# Patient Record
Sex: Female | Born: 1995 | Race: Black or African American | Hispanic: No | Marital: Single | Smoking: Current some day smoker
Health system: Southern US, Community
[De-identification: ages and names within clinical notes are randomized; demographics above are authoritative.]

## PROBLEM LIST (undated history)

## (undated) ENCOUNTER — Ambulatory Visit (HOSPITAL_COMMUNITY): Discharge status: Against Medical Advice | Payer: BLUE CROSS/BLUE SHIELD

## (undated) ENCOUNTER — Emergency Department (HOSPITAL_BASED_OUTPATIENT_CLINIC_OR_DEPARTMENT_OTHER): Admission: EM | Payer: No Typology Code available for payment source | Source: Home / Self Care

## (undated) DIAGNOSIS — G43709 Chronic migraine without aura, not intractable, without status migrainosus: Secondary | ICD-10-CM

## (undated) HISTORY — DX: Chronic migraine without aura, not intractable, without status migrainosus: G43.709

## (undated) HISTORY — PX: NO PAST SURGERIES: SHX2092

---
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
## 2015-03-05 ENCOUNTER — Encounter (HOSPITAL_BASED_OUTPATIENT_CLINIC_OR_DEPARTMENT_OTHER): Payer: Self-pay | Admitting: Emergency Medicine

## 2015-03-05 DIAGNOSIS — R51 Headache: Secondary | ICD-10-CM | POA: Diagnosis not present

## 2015-03-05 DIAGNOSIS — J45909 Unspecified asthma, uncomplicated: Secondary | ICD-10-CM | POA: Diagnosis not present

## 2015-03-05 NOTE — ED Notes (Signed)
Pt states she started having HA last Sunday went to school RN and they told her it ws allergies. Mom states she called crying because it wasn't getting any better.

## 2015-03-06 ENCOUNTER — Emergency Department (HOSPITAL_BASED_OUTPATIENT_CLINIC_OR_DEPARTMENT_OTHER)
Admission: EM | Admit: 2015-03-06 | Discharge: 2015-03-06 | Disposition: A | Discharge status: Home / Self Care | Payer: BLUE CROSS/BLUE SHIELD | Attending: Emergency Medicine | Admitting: Emergency Medicine

## 2015-03-06 ENCOUNTER — Emergency Department (HOSPITAL_BASED_OUTPATIENT_CLINIC_OR_DEPARTMENT_OTHER): Payer: BLUE CROSS/BLUE SHIELD

## 2015-03-06 DIAGNOSIS — R51 Headache: Secondary | ICD-10-CM

## 2015-03-06 DIAGNOSIS — R519 Headache, unspecified: Secondary | ICD-10-CM

## 2015-03-06 MED ORDER — KETOROLAC TROMETHAMINE 30 MG/ML IJ SOLN
30.0000 mg | Freq: Once | INTRAMUSCULAR | Status: AC
  Administered 2015-03-06: 30 mg via INTRAVENOUS
  Filled 2015-03-06: qty 1

## 2015-03-06 MED ORDER — DIPHENHYDRAMINE HCL 50 MG/ML IJ SOLN
25.0000 mg | Freq: Once | INTRAMUSCULAR | Status: AC
  Administered 2015-03-06: 25 mg via INTRAVENOUS
  Filled 2015-03-06: qty 1

## 2015-03-06 MED ORDER — METOCLOPRAMIDE HCL 10 MG PO TABS: 10.0000 mg | ORAL_TABLET | Freq: Four times a day (QID) | ORAL | Status: DC | PRN

## 2015-03-06 MED ORDER — SODIUM CHLORIDE 0.9 % IV BOLUS (SEPSIS)
1000.0000 mL | Freq: Once | INTRAVENOUS | Status: AC
  Administered 2015-03-06: 1000 mL via INTRAVENOUS

## 2015-03-06 MED ORDER — METOCLOPRAMIDE HCL 5 MG/ML IJ SOLN
10.0000 mg | Freq: Once | INTRAMUSCULAR | Status: AC
  Administered 2015-03-06: 10 mg via INTRAVENOUS
  Filled 2015-03-06: qty 2

## 2015-03-06 MED ORDER — HYDROCODONE-ACETAMINOPHEN 5-325 MG PO TABS
1.0000 | ORAL_TABLET | Freq: Four times a day (QID) | ORAL | Status: DC | PRN
Start: 2015-03-06 — End: 2017-09-01

## 2015-03-06 NOTE — ED Notes (Signed)
C/o ha x 10 days  Was given butalb-acetamine-caff at [facility]

## 2015-03-06 NOTE — ED Provider Notes (Signed)
CSN: [id number]     Arrival date & time 03/05/15  2216 History   First MD Initiated Contact with Patient 03/06/15 0150     Chief Complaint  Patient presents with  . Headache     (Consider location/radiation/quality/duration/timing/severity/associated sxs/prior Treatment) HPI  This is a 19 year old female complains of a headache the past 2 weeks. She states it is sometimes frontal and sometimes in the bitemporal regions. She rates it a 7 out of 10 at the present time. It does wax and wane. There is no associated blurred vision, photophobia, nausea or vomiting. There is no associated numbness or weakness. She has taken Tylenol without relief. She was given a Fioricet at school yesterday without relief. She does not have a history of chronic headaches nor does she have a family history of migraines.  Past Medical History  Diagnosis Date  . Asthma    History reviewed. No pertinent past surgical history. History reviewed. No pertinent family history. History  Substance Use Topics  . Smoking status: Never Smoker   . Smokeless tobacco: Not on file  . Alcohol Use: Yes   OB History    No data available     Review of Systems  All other systems reviewed and are negative.   Allergies  Review of patient's allergies indicates no known allergies.  Home Medications   Prior to Admission medications   Not on File   BP 118/68 mmHg  Pulse 56  Temp(Src) 98.3 F (36.8 C) (Oral)  Resp 18  Ht 5\' 2"  (1.575 m)  Wt 164 lb (74.39 kg)  BMI 29.99 kg/m2  SpO2 100%  LMP 03/05/2015   Physical Exam  General: Well-developed, well-nourished female in no acute distress; appearance consistent with age of record HENT: normocephalic; atraumatic Eyes: pupils equal, round and reactive to light; extraocular muscles intact Neck: supple Heart: regular rate and rhythm Lungs: clear to auscultation bilaterally Abdomen: soft; nondistended; nontender; bowel sounds present Extremities: No deformity; full  range of motion; pulses normal Neurologic: Awake, alert and oriented; motor function intact in all extremities and symmetric; no facial droop; normal coordination and speech; negative Romberg; normal finger to nose Skin: Warm and dry Psychiatric: Normal mood and affect    ED Course  Procedures (including critical care time)   MDM  Nursing notes and vitals signs, including pulse oximetry, reviewed.  Summary of this visit's results, reviewed by myself:  Imaging Studies: Ct Head Wo Contrast  03/06/2015   CLINICAL DATA:  Severe headaches for 2 weeks  EXAM: CT HEAD WITHOUT CONTRAST  TECHNIQUE: Contiguous axial images were obtained from the base of the skull through the vertex without intravenous contrast.  COMPARISON:  None.  FINDINGS: Ventricles and sulci appear symmetrical. No mass effect or midline shift. No abnormal extra-axial fluid collections. Gray-white matter junctions are distinct. Basal cisterns are not effaced. No evidence of acute intracranial hemorrhage. No depressed skull fractures. Visualized paranasal sinuses and mastoid air cells are not opacified.  IMPRESSION: No acute intracranial abnormalities.   Electronically Signed   By: Burman NievesWilliam  Stevens M.D.   On: 03/06/2015 02:38    4:35 AM Headache significantly improved after IV fluids and medications.    Paula LibraJohn Mingo Siegert, MD 03/06/15 [phone]

## 2015-10-06 ENCOUNTER — Emergency Department (HOSPITAL_COMMUNITY)
Admission: EM | Admit: 2015-10-06 | Discharge: 2015-10-07 | Disposition: A | Discharge status: Home / Self Care | Payer: BLUE CROSS/BLUE SHIELD | Attending: Emergency Medicine | Admitting: Emergency Medicine

## 2015-10-06 ENCOUNTER — Emergency Department (HOSPITAL_COMMUNITY): Payer: BLUE CROSS/BLUE SHIELD

## 2015-10-06 ENCOUNTER — Encounter (HOSPITAL_COMMUNITY): Payer: Self-pay | Admitting: *Deleted

## 2015-10-06 DIAGNOSIS — N739 Female pelvic inflammatory disease, unspecified: Secondary | ICD-10-CM | POA: Diagnosis not present

## 2015-10-06 DIAGNOSIS — J45909 Unspecified asthma, uncomplicated: Secondary | ICD-10-CM | POA: Diagnosis not present

## 2015-10-06 DIAGNOSIS — N730 Acute parametritis and pelvic cellulitis: Secondary | ICD-10-CM

## 2015-10-06 DIAGNOSIS — R109 Unspecified abdominal pain: Secondary | ICD-10-CM | POA: Diagnosis present

## 2015-10-06 DIAGNOSIS — Z3202 Encounter for pregnancy test, result negative: Secondary | ICD-10-CM | POA: Diagnosis not present

## 2015-10-06 LAB — URINE MICROSCOPIC-ADD ON

## 2015-10-06 LAB — CBC
HCT: 38.8 % (ref 36.0–46.0)
HEMOGLOBIN: 12.5 g/dL (ref 12.0–15.0)
MCH: 27.1 pg (ref 26.0–34.0)
MCHC: 32.2 g/dL (ref 30.0–36.0)
MCV: 84.2 fL (ref 78.0–100.0)
Platelets: 246 10*3/uL (ref 150–400)
RBC: 4.61 MIL/uL (ref 3.87–5.11)
RDW: 13.7 % (ref 11.5–15.5)
WBC: 6.9 10*3/uL (ref 4.0–10.5)

## 2015-10-06 LAB — COMPREHENSIVE METABOLIC PANEL
ALBUMIN: 3.7 g/dL (ref 3.5–5.0)
ALK PHOS: 47 U/L (ref 38–126)
ALT: 10 U/L — ABNORMAL LOW (ref 14–54)
AST: 18 U/L (ref 15–41)
Anion gap: 6 (ref 5–15)
BILIRUBIN TOTAL: 0.6 mg/dL (ref 0.3–1.2)
BUN: 6 mg/dL (ref 6–20)
CO2: 29 mmol/L (ref 22–32)
Calcium: 9 mg/dL (ref 8.9–10.3)
Chloride: 99 mmol/L — ABNORMAL LOW (ref 101–111)
Creatinine, Ser: 1.04 mg/dL — ABNORMAL HIGH (ref 0.44–1.00)
GFR calc Af Amer: >60 mL/min (ref >60)
GFR calc non Af Amer: >60 mL/min (ref >60)
GLUCOSE: 112 mg/dL — AB (ref 65–99)
Potassium: 3.3 mmol/L — ABNORMAL LOW (ref 3.5–5.1)
Sodium: 134 mmol/L — ABNORMAL LOW (ref 135–145)
TOTAL PROTEIN: 6.7 g/dL (ref 6.5–8.1)

## 2015-10-06 LAB — URINALYSIS, ROUTINE W REFLEX MICROSCOPIC
BILIRUBIN URINE: NEGATIVE
Glucose, UA: NEGATIVE mg/dL
HGB URINE DIPSTICK: NEGATIVE
Ketones, ur: NEGATIVE mg/dL
Nitrite: NEGATIVE
PROTEIN: NEGATIVE mg/dL
Specific Gravity, Urine: 1.01 (ref 1.005–1.030)
UROBILINOGEN UA: 0.2 mg/dL (ref 0.0–1.0)
pH: 7.5 (ref 5.0–8.0)

## 2015-10-06 LAB — WET PREP, GENITAL
Trich, Wet Prep: NONE SEEN
Yeast Wet Prep HPF POC: NONE SEEN

## 2015-10-06 LAB — POC URINE PREG, ED: PREG TEST UR: NEGATIVE

## 2015-10-06 LAB — LIPASE, BLOOD: Lipase: 26 U/L (ref 22–51)

## 2015-10-06 MED ORDER — SODIUM CHLORIDE 0.9 % IV BOLUS (SEPSIS)
1000.0000 mL | Freq: Once | INTRAVENOUS | Status: AC
  Administered 2015-10-06: 1000 mL via INTRAVENOUS

## 2015-10-06 MED ORDER — CEFTRIAXONE SODIUM 250 MG IJ SOLR
250.0000 mg | Freq: Once | INTRAMUSCULAR | Status: AC
  Administered 2015-10-06: 250 mg via INTRAMUSCULAR
  Filled 2015-10-06: qty 250

## 2015-10-06 MED ORDER — TRAMADOL HCL 50 MG PO TABS: 50.0000 mg | ORAL_TABLET | Freq: Four times a day (QID) | ORAL | Status: DC | PRN

## 2015-10-06 MED ORDER — IOHEXOL 300 MG/ML  SOLN
25.0000 mL | Freq: Once | INTRAMUSCULAR | Status: DC | PRN
  Administered 2015-10-06: 25 mL via ORAL
  Filled 2015-10-06: qty 30

## 2015-10-06 MED ORDER — IOHEXOL 300 MG/ML  SOLN
100.0000 mL | Freq: Once | INTRAMUSCULAR | Status: AC | PRN
  Administered 2015-10-06: 100 mL via INTRAVENOUS

## 2015-10-06 MED ORDER — DOXYCYCLINE HYCLATE 100 MG PO CAPS: 100.0000 mg | ORAL_CAPSULE | Freq: Two times a day (BID) | ORAL | Status: DC

## 2015-10-06 MED ORDER — KETOROLAC TROMETHAMINE 30 MG/ML IJ SOLN
30.0000 mg | Freq: Once | INTRAMUSCULAR | Status: AC
  Administered 2015-10-06: 30 mg via INTRAVENOUS
  Filled 2015-10-06: qty 1

## 2015-10-06 NOTE — ED Notes (Signed)
Pt returned from CT °

## 2015-10-06 NOTE — ED Notes (Signed)
The pt is c/o abd pain since yesterday no nv and d.  lmp last month

## 2015-10-06 NOTE — ED Notes (Signed)
Pt hooked up to the monitor with the BP cuff and pulse ox 

## 2015-10-06 NOTE — Discharge Instructions (Signed)

## 2015-10-06 NOTE — ED Provider Notes (Signed)
CSN: [id number]     Arrival date & time 10/06/15  2037 History   First MD Initiated Contact with Patient 10/06/15 2120     Chief Complaint  Patient presents with  . Abdominal Pain     (Consider location/radiation/quality/duration/timing/severity/associated sxs/prior Treatment) HPI Comments: 19 year old female withpast medical history presents to the emergency department for further evaluation of abdominal pain. Patient states that she has had abdominal pain intermittently in her right lower quadrant over the past month, but pain became fairly constant yesterday. Patient states the pain has been worsening. She describes the pain as sharp in nature when present. She also states that she has had increased pain with coughing or movement. She has taken ibuprofen for symptoms without relief. She reports some mild nausea yesterday without vomiting. No fever, vaginal discharge or bleeding, diarrhea, melena, or hematochezia. Last bowel movement was in the last 24 hours in normal, per patient. Patient's last menstrual period was one month ago. She has a history of irregular menses. She denies any history of prior pregnancies. She is sexually active with 1 partner and denies the use of condoms. Patient denies history of abdominal surgeries.  Patient is a 19 y.o. female presenting with abdominal pain. The history is provided by the patient. No language interpreter was used.  Abdominal Pain Associated symptoms: nausea     Past Medical History  Diagnosis Date  . Asthma    History reviewed. No pertinent past surgical history. No family history on file. Social History  Substance Use Topics  . Smoking status: Never Smoker   . Smokeless tobacco: None  . Alcohol Use: Yes   OB History    No data available      Review of Systems  Gastrointestinal: Positive for nausea and abdominal pain.  All other systems reviewed and are negative.   Allergies  Review of patient's allergies indicates no known  allergies.  Home Medications   Prior to Admission medications   Medication Sig Start Date End Date Taking? Authorizing Provider  ibuprofen (ADVIL,MOTRIN) 200 MG tablet Take 200 mg by mouth every 6 (six) hours as needed for moderate pain.   Yes Historical Provider, MD  doxycycline (VIBRAMYCIN) 100 MG capsule Take 1 capsule (100 mg total) by mouth 2 (two) times daily. Take for 14 days 10/06/15   Antony Madura, PA-C  HYDROcodone-acetaminophen (NORCO) 5-325 MG per tablet Take 1-2 tablets by mouth every 6 (six) hours as needed (headache). Patient not taking: Reported on 10/06/2015 03/06/15   Paula Libra, MD  metoCLOPramide (REGLAN) 10 MG tablet Take 1 tablet (10 mg total) by mouth every 6 (six) hours as needed (nausea or headache). Patient not taking: Reported on 10/06/2015 03/06/15   Paula Libra, MD  traMADol (ULTRAM) 50 MG tablet Take 1 tablet (50 mg total) by mouth every 6 (six) hours as needed. 10/06/15   Antony Madura, PA-C   BP 112/68 mmHg  Pulse 62  Temp(Src) 98.8 F (37.1 C)  Resp 16  Ht 5\' 2"  (1.575 m)  Wt 162 lb 4 oz (73.596 kg)  BMI 29.67 kg/m2  SpO2 100%  LMP 09/06/2015   Physical Exam  Constitutional: She is oriented to person, place, and time. She appears well-developed and well-nourished. No distress.  Nontoxic/nonseptic appearing.  HENT:  Head: Normocephalic and atraumatic.  Eyes: Conjunctivae and EOM are normal. No scleral icterus.  Neck: Normal range of motion.  Cardiovascular: Normal rate, regular rhythm and intact distal pulses.   Pulmonary/Chest: Effort normal. No respiratory distress. She has no  wheezes. She has no rales.  Respirations even and unlabored.  Abdominal: Soft. She exhibits no distension. There is tenderness. There is no rebound.  TTP is fairly diffuse, but patient reports worse pain with palpation in the RLQ. There is mild voluntary guarding to palpation in the RLQ. No masses or peritoneal signs. Abdomen soft.  Genitourinary: There is no rash, tenderness, lesion  or injury on the right labia. There is no rash, tenderness, lesion or injury on the left labia. Uterus is tender (mild/moderate). Cervix exhibits no motion tenderness. Right adnexum displays no mass, no tenderness and no fullness. Left adnexum displays no mass, no tenderness and no fullness. No bleeding in the vagina. Vaginal discharge (copious, white, thick) found.  Musculoskeletal: Normal range of motion.  Neurological: She is alert and oriented to person, place, and time. She exhibits normal muscle tone. Coordination normal.  Skin: Skin is warm and dry. No rash noted. She is not diaphoretic. No erythema. No pallor.  Psychiatric: She has a normal mood and affect. Her behavior is normal.  Nursing note and vitals reviewed.   ED Course  Procedures (including critical care time) Labs Review Labs Reviewed  WET PREP, GENITAL - Abnormal; Notable for the following:    Clue Cells Wet Prep HPF POC MANY (*)    WBC, Wet Prep HPF POC MANY (*)    All other components within normal limits  COMPREHENSIVE METABOLIC PANEL - Abnormal; Notable for the following:    Sodium 134 (*)    Potassium 3.3 (*)    Chloride 99 (*)    Glucose, Bld 112 (*)    Creatinine, Ser 1.04 (*)    ALT 10 (*)    All other components within normal limits  URINALYSIS, ROUTINE W REFLEX MICROSCOPIC (NOT AT [facility]) - Abnormal; Notable for the following:    APPearance CLOUDY (*)    Leukocytes, UA TRACE (*)    All other components within normal limits  URINE MICROSCOPIC-ADD ON - Abnormal; Notable for the following:    Squamous Epithelial / LPF FEW (*)    Bacteria, UA FEW (*)    All other components within normal limits  LIPASE, BLOOD  CBC  POC URINE PREG, ED  GC/CHLAMYDIA PROBE AMP ([place]) NOT AT [facility]    Imaging Review Ct Abdomen Pelvis W Contrast  10/06/2015   CLINICAL DATA:  19 year old female with lower abdomen and periumbilical pain since yesterday. Initial encounter.  EXAM: CT ABDOMEN AND PELVIS WITH CONTRAST   TECHNIQUE: Multidetector CT imaging of the abdomen and pelvis was performed using the standard protocol following bolus administration of intravenous contrast.  CONTRAST:  25mL OMNIPAQUE IOHEXOL 300 MG/ML SOLN, OMNIPAQUE IOHEXOL 300 MG/ML SOLN  COMPARISON:  None.  FINDINGS: Negative lung bases.  No pericardial or pleural effusion.  Negative visualized osseous structures. Probable benign bone island in the mid lumbar vertebral body.  Small volume of pelvic free fluid. Parametrial soft tissue stranding suggested along the lower uterine segment. There is gas in the vagina and mucosal hyper enhancement at the vaginal vault. Toe discrete adnexal abnormality. Uterine fundal enhancement might be physiologic.  Decompressed distal colon. Diminutive urinary bladder. Mildly redundant partially decompressed sigmoid.  Left colon, transverse colon, right colon, and appendix (series 2, image 58) are within normal limits. Negative terminal ileum. Oral contrast has not yet reached the mid small bowel. No dilated or abnormal small bowel loops identified. The stomach is distended with food and contrast. Negative duodenum.  No pneumoperitoneum or abdominal free fluid.  Liver, spleen, pancreas and adrenal glands are within normal limits. Portal venous system and major arterial structures are within normal limits. No lymphadenopathy identified. Both kidneys appear normal.  IMPRESSION: 1. Normal appendix. 2. Suggestion of inflammation about the lower uterine segment and vagina with trace pelvic free fluid. Consider pelvic inflammatory disease.   Electronically Signed   By: Odessa Fleming M.D.   On: 10/06/2015 23:13     I have personally reviewed and evaluated these images and lab results as part of my medical decision-making.   EKG Interpretation None      MDM   Final diagnoses:  PID (acute pelvic inflammatory disease)    Patient presenting to the emergency department for evaluation of abdominal pain. Abdominal pain is  present mostly in the right lower quadrant and has been worsening over the past 2 days. Patient has no leukocytosis or fever. Abdominal exam is nonsurgical; no peritoneal signs. Tenderness was noted mostly in the right lower quadrant and suprapubic abdomen, though tenderness was also fairly diffuse in nature. GU exam with mild uterine tenderness. No CMT or adnexal TTP.   CT obtained for further evaluation of symptoms. Appendix is normal in appearance. There is evidence of inflammation to the lower uterine segment suggestive of pelvic inflammatory disease. Many white blood cells on wet prep is consistent with this diagnosis. Will treat for PID today with 250 g IM Rocephin and oral doxycycline twice a day 14 days. Patient advised follow-up with an OB/GYN for recheck in one week. Return precautions discussed and provided. Patient agreeable to plan with no unaddressed concerns. Patient discharged in good condition.   Filed Vitals:   10/06/15 2200 10/06/15 2215 10/06/15 2306 10/06/15 2315  BP: 112/54 117/66 112/78 112/68  Pulse: 69 73  62  Temp:      Resp:   16   Height:      Weight:      SpO2: 100% 100%  100%     Antony Madura, PA-C 10/06/15 1308  Eber Hong, MD 10/07/15 [phone]

## 2015-10-07 LAB — CERVICOVAGINAL ANCILLARY ONLY
CHLAMYDIA, DNA PROBE: NEGATIVE
NEISSERIA GONORRHEA: NEGATIVE

## 2016-06-18 DIAGNOSIS — Z0000 Encounter for general adult medical examination without abnormal findings: Secondary | ICD-10-CM | POA: Diagnosis not present

## 2016-06-18 DIAGNOSIS — F329 Major depressive disorder, single episode, unspecified: Secondary | ICD-10-CM | POA: Diagnosis not present

## 2016-07-15 DIAGNOSIS — Z7189 Other specified counseling: Secondary | ICD-10-CM | POA: Diagnosis not present

## 2016-07-15 DIAGNOSIS — Z3009 Encounter for other general counseling and advice on contraception: Secondary | ICD-10-CM | POA: Diagnosis not present

## 2016-07-15 DIAGNOSIS — Z23 Encounter for immunization: Secondary | ICD-10-CM | POA: Diagnosis not present

## 2016-08-16 DIAGNOSIS — A09 Infectious gastroenteritis and colitis, unspecified: Secondary | ICD-10-CM | POA: Diagnosis not present

## 2016-11-10 ENCOUNTER — Encounter (HOSPITAL_COMMUNITY): Payer: Self-pay

## 2016-11-10 DIAGNOSIS — R109 Unspecified abdominal pain: Secondary | ICD-10-CM | POA: Diagnosis present

## 2016-11-10 DIAGNOSIS — R05 Cough: Secondary | ICD-10-CM | POA: Diagnosis not present

## 2016-11-10 DIAGNOSIS — Z79899 Other long term (current) drug therapy: Secondary | ICD-10-CM | POA: Insufficient documentation

## 2016-11-10 DIAGNOSIS — J45909 Unspecified asthma, uncomplicated: Secondary | ICD-10-CM | POA: Insufficient documentation

## 2016-11-10 DIAGNOSIS — R1033 Periumbilical pain: Secondary | ICD-10-CM | POA: Insufficient documentation

## 2016-11-10 LAB — URINALYSIS, ROUTINE W REFLEX MICROSCOPIC
BILIRUBIN URINE: NEGATIVE
GLUCOSE, UA: NEGATIVE mg/dL
HGB URINE DIPSTICK: NEGATIVE
Ketones, ur: NEGATIVE mg/dL
Leukocytes, UA: NEGATIVE
Nitrite: NEGATIVE
PROTEIN: NEGATIVE mg/dL
Specific Gravity, Urine: 1.004 — ABNORMAL LOW (ref 1.005–1.030)
pH: 7.5 (ref 5.0–8.0)

## 2016-11-10 LAB — CBC
HEMATOCRIT: 40.2 % (ref 36.0–46.0)
Hemoglobin: 13.3 g/dL (ref 12.0–15.0)
MCH: 27.7 pg (ref 26.0–34.0)
MCHC: 33.1 g/dL (ref 30.0–36.0)
MCV: 83.8 fL (ref 78.0–100.0)
Platelets: 185 10*3/uL (ref 150–400)
RBC: 4.8 MIL/uL (ref 3.87–5.11)
RDW: 14.1 % (ref 11.5–15.5)
WBC: 5.5 10*3/uL (ref 4.0–10.5)

## 2016-11-10 LAB — COMPREHENSIVE METABOLIC PANEL
ALT: 12 U/L — ABNORMAL LOW (ref 14–54)
AST: 22 U/L (ref 15–41)
Albumin: 4.1 g/dL (ref 3.5–5.0)
Alkaline Phosphatase: 45 U/L (ref 38–126)
Anion gap: 7 (ref 5–15)
BUN: 5 mg/dL — AB (ref 6–20)
CHLORIDE: 105 mmol/L (ref 101–111)
CO2: 26 mmol/L (ref 22–32)
Calcium: 9.3 mg/dL (ref 8.9–10.3)
Creatinine, Ser: 0.89 mg/dL (ref 0.44–1.00)
GFR calc Af Amer: >60 mL/min (ref >60)
GFR calc non Af Amer: >60 mL/min (ref >60)
Glucose, Bld: 93 mg/dL (ref 65–99)
Potassium: 3.2 mmol/L — ABNORMAL LOW (ref 3.5–5.1)
SODIUM: 138 mmol/L (ref 135–145)
Total Bilirubin: 0.4 mg/dL (ref 0.3–1.2)
Total Protein: 6.7 g/dL (ref 6.5–8.1)

## 2016-11-10 LAB — LIPASE, BLOOD: LIPASE: 29 U/L (ref 11–51)

## 2016-11-10 LAB — I-STAT BETA HCG BLOOD, ED (MC, WL, AP ONLY)

## 2016-11-10 NOTE — ED Triage Notes (Signed)
Onset 2 hours PTA abd pain around umbilicus, intermittant.  Onset 2 days non productive cough, and today cough caused vomiting.  No fever, diarrhea.

## 2016-11-11 ENCOUNTER — Emergency Department (HOSPITAL_COMMUNITY)
Admission: EM | Admit: 2016-11-11 | Discharge: 2016-11-11 | Disposition: A | Discharge status: Home / Self Care | Payer: BLUE CROSS/BLUE SHIELD | Attending: Emergency Medicine | Admitting: Emergency Medicine

## 2016-11-11 DIAGNOSIS — N760 Acute vaginitis: Secondary | ICD-10-CM | POA: Diagnosis not present

## 2016-11-11 DIAGNOSIS — R05 Cough: Secondary | ICD-10-CM | POA: Diagnosis not present

## 2016-11-11 DIAGNOSIS — B9689 Other specified bacterial agents as the cause of diseases classified elsewhere: Secondary | ICD-10-CM | POA: Diagnosis not present

## 2016-11-11 DIAGNOSIS — J209 Acute bronchitis, unspecified: Secondary | ICD-10-CM | POA: Diagnosis not present

## 2016-11-11 DIAGNOSIS — R1033 Periumbilical pain: Secondary | ICD-10-CM

## 2016-11-11 LAB — WET PREP, GENITAL
Sperm: NONE SEEN
Trich, Wet Prep: NONE SEEN
YEAST WET PREP: NONE SEEN

## 2016-11-11 MED ORDER — DICYCLOMINE HCL 10 MG PO CAPS
10.0000 mg | ORAL_CAPSULE | Freq: Once | ORAL | Status: AC
  Administered 2016-11-11: 10 mg via ORAL
  Filled 2016-11-11: qty 1

## 2016-11-11 MED ORDER — DICYCLOMINE HCL 20 MG PO TABS: 20.0000 mg | ORAL_TABLET | Freq: Two times a day (BID) | ORAL | 0 refills | Status: DC

## 2016-11-11 MED ORDER — KETOROLAC TROMETHAMINE 15 MG/ML IJ SOLN
15.0000 mg | Freq: Once | INTRAMUSCULAR | Status: AC
  Administered 2016-11-11: 15 mg via INTRAMUSCULAR
  Filled 2016-11-11: qty 1

## 2016-11-11 NOTE — ED Provider Notes (Signed)
[facility]-EMERGENCY DEPT Provider Note   CSN: [id number] Arrival date & time: 11/10/16  1946   By signing my name below, I, [name], attest that this documentation has been prepared under the direction and in the presence of Shon Baton, MD  Electronically Signed: [name], ED Scribe. 11/11/16. 1:04 AM.   History   Chief Complaint Chief Complaint  Patient presents with  . Abdominal Pain  . Cough   The history is provided by the patient. No language interpreter was used.   HPI Comments:  Wanda Guzman is a 20 y.o. female who presents to the Emergency Department complaining of sudden onset, intermittent episodes of moderate abdominal pain x several months. Pt states her pain worsened today but her current pain severity is a "3/10". She also notes a dry cough. No exacerbating and alleviating factors noted. Pt denies vomiting, diarrhea, hematuria, dysuria, vaginal discharge, abnormal bowel movments, concern for STDs and any other complaints at this time. Pt also denies any major medical problems and any drug allergies.   Past Medical History:  Diagnosis Date  . Asthma     There are no active problems to display for this patient.   History reviewed. No pertinent surgical history.  OB History    No data available       Home Medications    Prior to Admission medications   Medication Sig Start Date End Date Taking? Authorizing Provider  ibuprofen (ADVIL,MOTRIN) 200 MG tablet Take 200 mg by mouth every 6 (six) hours as needed for moderate pain.   Yes Historical Provider, MD  Phenylephrine-Pheniramine-DM [facility] COLD & COUGH PO) Take 30 mLs by mouth daily as needed. For flu and cold symptoms   Yes Historical Provider, MD  dicyclomine (BENTYL) 20 MG tablet Take 1 tablet (20 mg total) by mouth 2 (two) times daily. 11/11/16   Shon Baton, MD  doxycycline (VIBRAMYCIN) 100 MG capsule Take 1 capsule (100 mg total) by mouth 2 (two) times daily. Take for 14  days Patient not taking: Reported on 11/10/2016 10/06/15   Antony Madura, PA-C  HYDROcodone-acetaminophen (NORCO) 5-325 MG per tablet Take 1-2 tablets by mouth every 6 (six) hours as needed (headache). Patient not taking: Reported on 11/10/2016 03/06/15   Paula Libra, MD  metoCLOPramide (REGLAN) 10 MG tablet Take 1 tablet (10 mg total) by mouth every 6 (six) hours as needed (nausea or headache). Patient not taking: Reported on 11/10/2016 03/06/15   Paula Libra, MD  traMADol (ULTRAM) 50 MG tablet Take 1 tablet (50 mg total) by mouth every 6 (six) hours as needed. Patient not taking: Reported on 11/10/2016 10/06/15   Antony Madura, PA-C    Family History History reviewed. No pertinent family history.  Social History Social History  Substance Use Topics  . Smoking status: Never Smoker  . Smokeless tobacco: Never Used  . Alcohol use Yes     Allergies   Patient has no known allergies.   Review of Systems Review of Systems  Constitutional: Negative for fever.  Respiratory: Positive for cough.   Gastrointestinal: Positive for abdominal pain. Negative for diarrhea and vomiting.  Genitourinary: Negative for dysuria, hematuria and vaginal discharge.  All other systems reviewed and are negative.    Physical Exam Updated Vital Signs BP 122/63 (BP Location: Left Arm)   Pulse 86   Temp 98.8 F (37.1 C) (Oral)   Resp 20   Ht 5\' 2"  (1.575 m)   Wt 145 lb 6.4 oz (66 kg)   LMP  10/20/2016   SpO2 100%   BMI 26.59 kg/m   Physical Exam  Constitutional: She is oriented to person, place, and time. She appears well-developed and well-nourished. No distress.  HENT:  Head: Normocephalic and atraumatic.  Cardiovascular: Normal rate, regular rhythm and normal heart sounds.   Pulmonary/Chest: Effort normal and breath sounds normal. No respiratory distress. She has no wheezes.  Abdominal: Soft. Bowel sounds are normal. She exhibits no mass. There is no tenderness. There is no guarding.  Genitourinary:  Vagina normal.  Genitourinary Comments: Normal external vaginal exam, moderate vaginal discharge, his cervical motion tenderness, no adnexal tenderness or mass  Neurological: She is alert and oriented to person, place, and time.  Skin: Skin is warm and dry.  Psychiatric: She has a normal mood and affect.  Nursing note and vitals reviewed.    ED Treatments / Results  DIAGNOSTIC STUDIES:  Oxygen Saturation is 100% on RA, normal by my interpretation.    COORDINATION OF CARE:  1:00 AM Discussed treatment plan with pt at bedside and pt agreed to plan.  Labs (all labs ordered are listed, but only abnormal results are displayed) Labs Reviewed  WET PREP, GENITAL - Abnormal; Notable for the following:       Result Value   Clue Cells Wet Prep HPF POC PRESENT (*)    WBC, Wet Prep HPF POC MANY (*)    All other components within normal limits  COMPREHENSIVE METABOLIC PANEL - Abnormal; Notable for the following:    Potassium 3.2 (*)    BUN 5 (*)    ALT 12 (*)    All other components within normal limits  URINALYSIS, ROUTINE W REFLEX MICROSCOPIC (NOT AT [facility]) - Abnormal; Notable for the following:    Color, Urine STRAW (*)    Specific Gravity, Urine 1.004 (*)    All other components within normal limits  LIPASE, BLOOD  CBC  I-STAT BETA HCG BLOOD, ED ([facility], [facility], [facility] ONLY)  GC/CHLAMYDIA PROBE AMP ([place]) NOT AT [facility]    EKG  EKG Interpretation None       Radiology No results found.  Procedures Procedures (including critical care time)  Medications Ordered in ED Medications  dicyclomine (BENTYL) capsule 10 mg (10 mg Oral Given 11/11/16 0144)  ketorolac (TORADOL) 15 MG/ML injection 15 mg (15 mg Intramuscular Given 11/11/16 0144)     Initial Impression / Assessment and Plan / ED Course  I have reviewed the triage vital signs and the nursing notes.  Pertinent labs & imaging results that were available during my care of the patient were reviewed by me and considered in my  medical decision making (see chart for details).  Clinical Course     Patient presents with abdominal pain. Ongoing for several months. Intermittent. Currently comfortable. Vital signs reassuring. Abdominal exam is benign as is pelvic exam. She was tested for STDs. She deferred treatment. She reports improvement with Bentyl. Will discharge with Bentyl. Primary care follow-up if symptoms continue as well as women's clinic follow-up. Doubt acute emergent process.  After history, exam, and medical workup I feel the patient has been appropriately medically screened and is safe for discharge home. Pertinent diagnoses were discussed with the patient. Patient was given return precautions.   Final Clinical Impressions(s) / ED Diagnoses   Final diagnoses:  Periumbilical abdominal pain    New Prescriptions New Prescriptions   DICYCLOMINE (BENTYL) 20 MG TABLET    Take 1 tablet (20 mg total) by mouth 2 (two) times daily.  I personally performed the services described in this documentation, which was scribed in my presence. The recorded information has been reviewed and is accurate.     Shon Batonourtney F Machelle Raybon, MD 11/11/16 [phone]

## 2016-11-11 NOTE — Discharge Instructions (Signed)
You were seen today for abdominal pain. The cause of your pain is unknown at this time. However, your workup is reassuring. Given that the pain comes and goes it may be related to a more chronic issue. Follow-up with outpatient women's clinic and her primary physician if symptoms persist.

## 2016-11-12 LAB — GC/CHLAMYDIA PROBE AMP (~~LOC~~) NOT AT ARMC
Chlamydia: NEGATIVE
Neisseria Gonorrhea: NEGATIVE

## 2017-09-01 ENCOUNTER — Encounter (HOSPITAL_COMMUNITY): Payer: Self-pay | Admitting: Emergency Medicine

## 2017-09-01 ENCOUNTER — Emergency Department (HOSPITAL_COMMUNITY)
Admission: EM | Admit: 2017-09-01 | Discharge: 2017-09-01 | Disposition: A | Discharge status: Home / Self Care | Payer: BLUE CROSS/BLUE SHIELD | Attending: Emergency Medicine | Admitting: Emergency Medicine

## 2017-09-01 ENCOUNTER — Emergency Department (HOSPITAL_COMMUNITY): Payer: BLUE CROSS/BLUE SHIELD

## 2017-09-01 DIAGNOSIS — S0083XA Contusion of other part of head, initial encounter: Secondary | ICD-10-CM | POA: Diagnosis not present

## 2017-09-01 DIAGNOSIS — T07XXXA Unspecified multiple injuries, initial encounter: Secondary | ICD-10-CM

## 2017-09-01 DIAGNOSIS — Y929 Unspecified place or not applicable: Secondary | ICD-10-CM | POA: Insufficient documentation

## 2017-09-01 DIAGNOSIS — R55 Syncope and collapse: Secondary | ICD-10-CM | POA: Diagnosis not present

## 2017-09-01 DIAGNOSIS — Z23 Encounter for immunization: Secondary | ICD-10-CM | POA: Diagnosis not present

## 2017-09-01 DIAGNOSIS — J45909 Unspecified asthma, uncomplicated: Secondary | ICD-10-CM | POA: Insufficient documentation

## 2017-09-01 DIAGNOSIS — Y999 Unspecified external cause status: Secondary | ICD-10-CM | POA: Insufficient documentation

## 2017-09-01 DIAGNOSIS — W1830XA Fall on same level, unspecified, initial encounter: Secondary | ICD-10-CM | POA: Diagnosis not present

## 2017-09-01 DIAGNOSIS — S0990XA Unspecified injury of head, initial encounter: Secondary | ICD-10-CM | POA: Diagnosis not present

## 2017-09-01 DIAGNOSIS — Y939 Activity, unspecified: Secondary | ICD-10-CM | POA: Diagnosis not present

## 2017-09-01 DIAGNOSIS — S0091XA Abrasion of unspecified part of head, initial encounter: Secondary | ICD-10-CM | POA: Diagnosis not present

## 2017-09-01 DIAGNOSIS — S0993XA Unspecified injury of face, initial encounter: Secondary | ICD-10-CM | POA: Diagnosis not present

## 2017-09-01 DIAGNOSIS — S0081XA Abrasion of other part of head, initial encounter: Secondary | ICD-10-CM | POA: Diagnosis not present

## 2017-09-01 LAB — BASIC METABOLIC PANEL
Anion gap: 10 (ref 5–15)
BUN: 9 mg/dL (ref 6–20)
CHLORIDE: 102 mmol/L (ref 101–111)
CO2: 24 mmol/L (ref 22–32)
Calcium: 8.9 mg/dL (ref 8.9–10.3)
Creatinine, Ser: 0.91 mg/dL (ref 0.44–1.00)
GFR calc Af Amer: >60 mL/min (ref >60)
GLUCOSE: 165 mg/dL — AB (ref 65–99)
POTASSIUM: 3.3 mmol/L — AB (ref 3.5–5.1)
Sodium: 136 mmol/L (ref 135–145)

## 2017-09-01 LAB — CBC
HEMATOCRIT: 36.9 % (ref 36.0–46.0)
HEMOGLOBIN: 12.4 g/dL (ref 12.0–15.0)
MCH: 29.2 pg (ref 26.0–34.0)
MCHC: 33.6 g/dL (ref 30.0–36.0)
MCV: 86.8 fL (ref 78.0–100.0)
Platelets: 197 10*3/uL (ref 150–400)
RBC: 4.25 MIL/uL (ref 3.87–5.11)
RDW: 14.4 % (ref 11.5–15.5)
WBC: 17 10*3/uL — ABNORMAL HIGH (ref 4.0–10.5)

## 2017-09-01 LAB — I-STAT BETA HCG BLOOD, ED (MC, WL, AP ONLY): I-stat hCG, quantitative: <5 m[IU]/mL (ref <5)

## 2017-09-01 MED ORDER — BACITRACIN ZINC 500 UNIT/GM EX OINT
TOPICAL_OINTMENT | CUTANEOUS | Status: AC
  Administered 2017-09-01: 1
  Filled 2017-09-01: qty 1.8

## 2017-09-01 MED ORDER — TETANUS-DIPHTH-ACELL PERTUSSIS 5-2.5-18.5 LF-MCG/0.5 IM SUSP
0.5000 mL | Freq: Once | INTRAMUSCULAR | Status: AC
  Administered 2017-09-01: 0.5 mL via INTRAMUSCULAR
  Filled 2017-09-01: qty 0.5

## 2017-09-01 MED ORDER — IBUPROFEN 600 MG PO TABS: 600.0000 mg | ORAL_TABLET | Freq: Four times a day (QID) | ORAL | 0 refills | Status: DC | PRN

## 2017-09-01 MED ORDER — BACIT-POLY-NEO HC 1 % EX OINT
TOPICAL_OINTMENT | Freq: Once | CUTANEOUS | Status: DC
  Filled 2017-09-01: qty 15

## 2017-09-01 MED ORDER — IBUPROFEN 200 MG PO TABS
600.0000 mg | ORAL_TABLET | Freq: Once | ORAL | Status: AC
  Administered 2017-09-01: 600 mg via ORAL
  Filled 2017-09-01: qty 3

## 2017-09-01 NOTE — ED Notes (Signed)
Pt c/o pain 8/10 reports that she has not received anything for it. RN will notify EDP

## 2017-09-01 NOTE — ED Triage Notes (Signed)
Pt comes in via EMS after a syncopal episode to where she fell on concrete and has abrasions noted to her face and posterior part of her hands.  A&O x4 on arrival.  Episode of vomiting noted after syncope. Endorses past occurrences without seeking medical care. Vitals WNL.  No obvious orthostatic changes. Denies neck/back pain. Pt states she has not had much to eat or drink today.

## 2017-09-01 NOTE — ED Notes (Signed)
Pt verbalized understanding of discharge instructions and denies any further questions at this time.   

## 2017-09-01 NOTE — ED Notes (Signed)
Pt sts she is unable to give urine sample at this time 

## 2017-09-01 NOTE — ED Provider Notes (Signed)
[facility]-EMERGENCY DEPT Provider Note   CSN: [id number] Arrival date & time: 09/01/17  0039     History   Chief Complaint Chief Complaint  Patient presents with  . Loss of Consciousness  . Facial Injury    HPI Wanda Guzman is a 21 y.o. female.  The history is provided by the patient. No language interpreter was used.  Loss of Consciousness   This is a new problem. The current episode started 6 to 12 hours ago. The problem occurs constantly. She lost consciousness for a period of less than one minute. The problem is associated with normal activity. Pertinent negatives include fever. She has tried nothing for the symptoms. The treatment provided no relief. Her past medical history does not include HTN or seizures.  Facial Injury  Pt reports she hit her head.  Pt complains of abrasions on her face.  Pt unsure of what happened.  Pt reports she did not eat or drink yesterday.  Past Medical History:  Diagnosis Date  . Asthma     There are no active problems to display for this patient.   History reviewed. No pertinent surgical history.  OB History    No data available       Home Medications    Prior to Admission medications   Medication Sig Start Date End Date Taking? Authorizing Provider  ibuprofen (ADVIL,MOTRIN) 200 MG tablet Take 400-600 mg by mouth every 6 (six) hours as needed for moderate pain.    Yes [provider]  Pseudoephedrine-Guaifenesin (MUCINEX D MAX STRENGTH) [phone] MG TB12 Take 2 tablets by mouth every 6 (six) hours as needed. Cough   Yes [provider]  dicyclomine (BENTYL) 20 MG tablet Take 1 tablet (20 mg total) by mouth 2 (two) times daily. Patient not taking: Reported on 09/01/2017 11/11/16   Horton, Mayer Masker, MD  doxycycline (VIBRAMYCIN) 100 MG capsule Take 1 capsule (100 mg total) by mouth 2 (two) times daily. Take for 14 days Patient not taking: Reported on 11/10/2016 10/06/15   Antony Madura, PA-C  HYDROcodone-acetaminophen  (NORCO) 5-325 MG per tablet Take 1-2 tablets by mouth every 6 (six) hours as needed (headache). Patient not taking: Reported on 11/10/2016 03/06/15   Molpus, Jonny Ruiz, MD  metoCLOPramide (REGLAN) 10 MG tablet Take 1 tablet (10 mg total) by mouth every 6 (six) hours as needed (nausea or headache). Patient not taking: Reported on 11/10/2016 03/06/15   Molpus, Jonny Ruiz, MD  traMADol (ULTRAM) 50 MG tablet Take 1 tablet (50 mg total) by mouth every 6 (six) hours as needed. Patient not taking: Reported on 11/10/2016 10/06/15   Antony Madura, PA-C    Family History No family history on file.  Social History Social History  Substance Use Topics  . Smoking status: Never Smoker  . Smokeless tobacco: Never Used  . Alcohol use Yes     Allergies   Patient has no known allergies.   Review of Systems Review of Systems  Constitutional: Negative for fever.  Cardiovascular: Positive for syncope.  All other systems reviewed and are negative.    Physical Exam Updated Vital Signs BP 123/78 (BP Location: Right Arm)   Pulse 60   Temp 98.3 F (36.8 C) (Oral)   Resp 14   LMP 08/20/2017   SpO2 100%   Physical Exam  Constitutional: She is oriented to person, place, and time. She appears well-developed and well-nourished.  HENT:  Head: Normocephalic.  Right Ear: External ear normal.  Left Ear: External ear normal.  Mouth/Throat:  Oropharynx is clear and moist.  Multiple facial abrasions,  Superficial laceration right nose.   Eyes: Pupils are equal, round, and reactive to light.  Neck: Normal range of motion.  Cardiovascular: Normal rate and regular rhythm.   Pulmonary/Chest: Effort normal and breath sounds normal.  Abdominal: Soft. Bowel sounds are normal.  Musculoskeletal: Normal range of motion.  Neurological: She is alert and oriented to person, place, and time.  Skin: Skin is warm.  Psychiatric: She has a normal mood and affect.  Nursing note and vitals reviewed.    ED Treatments / Results    Labs (all labs ordered are listed, but only abnormal results are displayed) Labs Reviewed  BASIC METABOLIC PANEL - Abnormal; Notable for the following:       Result Value   Potassium 3.3 (*)    Glucose, Bld 165 (*)    All other components within normal limits  CBC - Abnormal; Notable for the following:    WBC 17.0 (*)    All other components within normal limits  URINALYSIS, ROUTINE W REFLEX MICROSCOPIC  I-STAT BETA HCG BLOOD, ED ([facility], [facility], [facility] ONLY)  CBG MONITORING, ED    EKG  EKG Interpretation  Date/Time:  Wednesday September 01 2017 00:52:18 EDT Ventricular Rate:  63 PR Interval:    QRS Duration: 95 QT Interval:  351 QTC Calculation: 360 R Axis:   75 Text Interpretation:  Sinus rhythm RSR' in V1 or V2, probably normal variant No previous ECGs available Confirmed by Zadie RhineWickline, Donald ([id number]) on 09/01/2017 1:17:05 AM Also confirmed by Zadie RhineWickline, Donald ([id number]), editor Misty StanleyScales-Price, Shannon [phone]([id number])  on 09/01/2017 7:31:40 AM       Radiology Ct Head Wo Contrast  Result Date: 09/01/2017 CLINICAL DATA:  Diffuse headache, facial pain, frontal hematoma and right temporal abrasion following a syncopal episode last night, resulting in a fall. EXAM: CT HEAD WITHOUT CONTRAST CT MAXILLOFACIAL WITHOUT CONTRAST TECHNIQUE: Multidetector CT imaging of the head and maxillofacial structures were performed using the standard protocol without intravenous contrast. Multiplanar CT image reconstructions of the maxillofacial structures were also generated. COMPARISON:  Head CT dated 03/06/2015. FINDINGS: CT HEAD FINDINGS Brain: No evidence of acute infarction, hemorrhage, hydrocephalus, extra-axial collection or mass lesion/mass effect. Vascular: No hyperdense vessel or unexpected calcification. Skull: Normal. Negative for fracture or focal lesion. Other: Right frontal scalp laceration. CT MAXILLOFACIAL FINDINGS Osseous: There are several small bone fragments in the fossa of an extracted left lower wisdom tooth  and a single small bone fragment in the fossa of an extracted upper left wisdom tooth. No facial fractures are seen. Orbits: Negative. No traumatic or inflammatory finding. Sinuses: Bilateral maxillary, ethmoid and sphenoid sinus mucosal thickening. Aplastic frontal sinuses. Soft tissues: Mild right frontal scalp soft tissue swelling. IMPRESSION: 1. No skull fracture or intracranial hemorrhage. 2. No maxillofacial fracture. 3. Chronic sinusitis. Electronically Signed   By: Beckie SaltsSteven  Reid M.D.   On: 09/01/2017 07:30   Ct Maxillofacial Wo Contrast  Result Date: 09/01/2017 CLINICAL DATA:  Diffuse headache, facial pain, frontal hematoma and right temporal abrasion following a syncopal episode last night, resulting in a fall. EXAM: CT HEAD WITHOUT CONTRAST CT MAXILLOFACIAL WITHOUT CONTRAST TECHNIQUE: Multidetector CT imaging of the head and maxillofacial structures were performed using the standard protocol without intravenous contrast. Multiplanar CT image reconstructions of the maxillofacial structures were also generated. COMPARISON:  Head CT dated 03/06/2015. FINDINGS: CT HEAD FINDINGS Brain: No evidence of acute infarction, hemorrhage, hydrocephalus, extra-axial collection or mass lesion/mass effect. Vascular: No hyperdense vessel or  unexpected calcification. Skull: Normal. Negative for fracture or focal lesion. Other: Right frontal scalp laceration. CT MAXILLOFACIAL FINDINGS Osseous: There are several small bone fragments in the fossa of an extracted left lower wisdom tooth and a single small bone fragment in the fossa of an extracted upper left wisdom tooth. No facial fractures are seen. Orbits: Negative. No traumatic or inflammatory finding. Sinuses: Bilateral maxillary, ethmoid and sphenoid sinus mucosal thickening. Aplastic frontal sinuses. Soft tissues: Mild right frontal scalp soft tissue swelling. IMPRESSION: 1. No skull fracture or intracranial hemorrhage. 2. No maxillofacial fracture. 3. Chronic  sinusitis. Electronically Signed   By: Beckie Salts M.D.   On: 09/01/2017 07:30    Procedures Procedures (including critical care time)  Medications Ordered in ED Medications  ibuprofen (ADVIL,MOTRIN) tablet 600 mg (600 mg Oral Given 09/01/17 0741)     Initial Impression / Assessment and Plan / ED Course  I have reviewed the triage vital signs and the nursing notes.  Pertinent labs & imaging results that were available during my care of the patient were reviewed by me and considered in my medical decision making (see chart for details).       Final Clinical Impressions(s) / ED Diagnoses   Final diagnoses:  Contusion of face, initial encounter  Syncope and collapse  Abrasions of multiple sites    New Prescriptions New Prescriptions   IBUPROFEN (ADVIL,MOTRIN) 600 MG TABLET    Take 1 tablet (600 mg total) by mouth every 6 (six) hours as needed.     Elson Areas, PA-C 09/01/17 0800    Zadie Rhine, MD 09/02/17 [phone]

## 2018-02-17 DIAGNOSIS — F329 Major depressive disorder, single episode, unspecified: Secondary | ICD-10-CM | POA: Diagnosis not present

## 2018-02-17 DIAGNOSIS — R51 Headache: Secondary | ICD-10-CM | POA: Diagnosis not present

## 2018-04-01 DIAGNOSIS — R350 Frequency of micturition: Secondary | ICD-10-CM | POA: Diagnosis not present

## 2018-04-01 DIAGNOSIS — K529 Noninfective gastroenteritis and colitis, unspecified: Secondary | ICD-10-CM | POA: Diagnosis not present

## 2018-04-01 DIAGNOSIS — R824 Acetonuria: Secondary | ICD-10-CM | POA: Diagnosis not present

## 2018-04-18 DIAGNOSIS — R51 Headache: Secondary | ICD-10-CM | POA: Diagnosis not present

## 2018-05-10 DIAGNOSIS — G43719 Chronic migraine without aura, intractable, without status migrainosus: Secondary | ICD-10-CM | POA: Diagnosis not present

## 2018-05-10 DIAGNOSIS — Z049 Encounter for examination and observation for unspecified reason: Secondary | ICD-10-CM | POA: Diagnosis not present

## 2018-05-25 DIAGNOSIS — M791 Myalgia, unspecified site: Secondary | ICD-10-CM | POA: Diagnosis not present

## 2018-05-25 DIAGNOSIS — R51 Headache: Secondary | ICD-10-CM | POA: Diagnosis not present

## 2018-05-25 DIAGNOSIS — G43719 Chronic migraine without aura, intractable, without status migrainosus: Secondary | ICD-10-CM | POA: Diagnosis not present

## 2018-05-25 DIAGNOSIS — G518 Other disorders of facial nerve: Secondary | ICD-10-CM | POA: Diagnosis not present

## 2018-05-25 DIAGNOSIS — M542 Cervicalgia: Secondary | ICD-10-CM | POA: Diagnosis not present

## 2018-06-08 DIAGNOSIS — G43719 Chronic migraine without aura, intractable, without status migrainosus: Secondary | ICD-10-CM | POA: Diagnosis not present

## 2018-06-08 DIAGNOSIS — G518 Other disorders of facial nerve: Secondary | ICD-10-CM | POA: Diagnosis not present

## 2018-06-08 DIAGNOSIS — M791 Myalgia, unspecified site: Secondary | ICD-10-CM | POA: Diagnosis not present

## 2018-06-08 DIAGNOSIS — R51 Headache: Secondary | ICD-10-CM | POA: Diagnosis not present

## 2018-06-08 DIAGNOSIS — M542 Cervicalgia: Secondary | ICD-10-CM | POA: Diagnosis not present

## 2018-06-22 DIAGNOSIS — G518 Other disorders of facial nerve: Secondary | ICD-10-CM | POA: Diagnosis not present

## 2018-06-22 DIAGNOSIS — R51 Headache: Secondary | ICD-10-CM | POA: Diagnosis not present

## 2018-06-22 DIAGNOSIS — M791 Myalgia, unspecified site: Secondary | ICD-10-CM | POA: Diagnosis not present

## 2018-06-22 DIAGNOSIS — M542 Cervicalgia: Secondary | ICD-10-CM | POA: Diagnosis not present

## 2018-06-22 DIAGNOSIS — G43719 Chronic migraine without aura, intractable, without status migrainosus: Secondary | ICD-10-CM | POA: Diagnosis not present

## 2018-07-06 DIAGNOSIS — G518 Other disorders of facial nerve: Secondary | ICD-10-CM | POA: Diagnosis not present

## 2018-07-06 DIAGNOSIS — R51 Headache: Secondary | ICD-10-CM | POA: Diagnosis not present

## 2018-07-06 DIAGNOSIS — M542 Cervicalgia: Secondary | ICD-10-CM | POA: Diagnosis not present

## 2018-07-06 DIAGNOSIS — M791 Myalgia, unspecified site: Secondary | ICD-10-CM | POA: Diagnosis not present

## 2018-07-06 DIAGNOSIS — G43719 Chronic migraine without aura, intractable, without status migrainosus: Secondary | ICD-10-CM | POA: Diagnosis not present

## 2018-07-20 DIAGNOSIS — G43719 Chronic migraine without aura, intractable, without status migrainosus: Secondary | ICD-10-CM | POA: Diagnosis not present

## 2018-07-20 DIAGNOSIS — M791 Myalgia, unspecified site: Secondary | ICD-10-CM | POA: Diagnosis not present

## 2018-07-20 DIAGNOSIS — M542 Cervicalgia: Secondary | ICD-10-CM | POA: Diagnosis not present

## 2018-07-20 DIAGNOSIS — R51 Headache: Secondary | ICD-10-CM | POA: Diagnosis not present

## 2018-07-20 DIAGNOSIS — G518 Other disorders of facial nerve: Secondary | ICD-10-CM | POA: Diagnosis not present

## 2018-08-03 DIAGNOSIS — G518 Other disorders of facial nerve: Secondary | ICD-10-CM | POA: Diagnosis not present

## 2018-08-03 DIAGNOSIS — M791 Myalgia, unspecified site: Secondary | ICD-10-CM | POA: Diagnosis not present

## 2018-08-03 DIAGNOSIS — R51 Headache: Secondary | ICD-10-CM | POA: Diagnosis not present

## 2018-08-03 DIAGNOSIS — G43719 Chronic migraine without aura, intractable, without status migrainosus: Secondary | ICD-10-CM | POA: Diagnosis not present

## 2018-08-03 DIAGNOSIS — M542 Cervicalgia: Secondary | ICD-10-CM | POA: Diagnosis not present

## 2018-09-16 DIAGNOSIS — G43719 Chronic migraine without aura, intractable, without status migrainosus: Secondary | ICD-10-CM | POA: Diagnosis not present

## 2018-09-27 ENCOUNTER — Emergency Department (HOSPITAL_COMMUNITY)
Admission: EM | Admit: 2018-09-27 | Discharge: 2018-09-27 | Disposition: A | Discharge status: Home / Self Care | Payer: BLUE CROSS/BLUE SHIELD | Attending: Emergency Medicine | Admitting: Emergency Medicine

## 2018-09-27 ENCOUNTER — Emergency Department (HOSPITAL_COMMUNITY): Payer: BLUE CROSS/BLUE SHIELD

## 2018-09-27 ENCOUNTER — Encounter (HOSPITAL_COMMUNITY): Payer: Self-pay | Admitting: Emergency Medicine

## 2018-09-27 DIAGNOSIS — R55 Syncope and collapse: Secondary | ICD-10-CM | POA: Diagnosis not present

## 2018-09-27 DIAGNOSIS — T1490XA Injury, unspecified, initial encounter: Secondary | ICD-10-CM

## 2018-09-27 DIAGNOSIS — W19XXXA Unspecified fall, initial encounter: Secondary | ICD-10-CM | POA: Diagnosis not present

## 2018-09-27 DIAGNOSIS — S025XXA Fracture of tooth (traumatic), initial encounter for closed fracture: Secondary | ICD-10-CM | POA: Diagnosis not present

## 2018-09-27 DIAGNOSIS — S0993XA Unspecified injury of face, initial encounter: Secondary | ICD-10-CM

## 2018-09-27 DIAGNOSIS — S0081XA Abrasion of other part of head, initial encounter: Secondary | ICD-10-CM | POA: Diagnosis not present

## 2018-09-27 DIAGNOSIS — J45909 Unspecified asthma, uncomplicated: Secondary | ICD-10-CM | POA: Insufficient documentation

## 2018-09-27 DIAGNOSIS — S01511A Laceration without foreign body of lip, initial encounter: Secondary | ICD-10-CM | POA: Insufficient documentation

## 2018-09-27 DIAGNOSIS — Z23 Encounter for immunization: Secondary | ICD-10-CM | POA: Diagnosis not present

## 2018-09-27 DIAGNOSIS — Y929 Unspecified place or not applicable: Secondary | ICD-10-CM | POA: Diagnosis not present

## 2018-09-27 DIAGNOSIS — Y998 Other external cause status: Secondary | ICD-10-CM | POA: Diagnosis not present

## 2018-09-27 DIAGNOSIS — Y9301 Activity, walking, marching and hiking: Secondary | ICD-10-CM | POA: Insufficient documentation

## 2018-09-27 DIAGNOSIS — S199XXA Unspecified injury of neck, initial encounter: Secondary | ICD-10-CM | POA: Diagnosis not present

## 2018-09-27 LAB — I-STAT CHEM 8, ED
BUN: 9 mg/dL (ref 6–20)
CHLORIDE: 101 mmol/L (ref 98–111)
Calcium, Ion: 1.09 mmol/L — ABNORMAL LOW (ref 1.15–1.40)
Creatinine, Ser: 0.9 mg/dL (ref 0.44–1.00)
GLUCOSE: 96 mg/dL (ref 70–99)
HCT: 41 % (ref 36.0–46.0)
Hemoglobin: 13.9 g/dL (ref 12.0–15.0)
POTASSIUM: 3.3 mmol/L — AB (ref 3.5–5.1)
Sodium: 139 mmol/L (ref 135–145)
TCO2: 25 mmol/L (ref 22–32)

## 2018-09-27 LAB — I-STAT BETA HCG BLOOD, ED (MC, WL, AP ONLY)

## 2018-09-27 MED ORDER — BUPIVACAINE-EPINEPHRINE (PF) 0.5% -1:200000 IJ SOLN
3.6000 mL | Freq: Once | INTRAMUSCULAR | Status: AC
  Administered 2018-09-27: 3.6 mL
  Filled 2018-09-27: qty 3.6

## 2018-09-27 MED ORDER — TETANUS-DIPHTH-ACELL PERTUSSIS 5-2.5-18.5 LF-MCG/0.5 IM SUSP
0.5000 mL | Freq: Once | INTRAMUSCULAR | Status: AC
  Administered 2018-09-27: 0.5 mL via INTRAMUSCULAR
  Filled 2018-09-27: qty 0.5

## 2018-09-27 MED ORDER — IBUPROFEN 600 MG PO TABS: 600.0000 mg | ORAL_TABLET | Freq: Four times a day (QID) | ORAL | 0 refills | Status: DC | PRN

## 2018-09-27 MED ORDER — BACITRACIN ZINC 500 UNIT/GM EX OINT: 1.0000 "application " | TOPICAL_OINTMENT | Freq: Two times a day (BID) | CUTANEOUS | 0 refills | Status: DC

## 2018-09-27 NOTE — Discharge Instructions (Addendum)
Your imaging and blood work in the emergency department were reassuring.  For your lip laceration, we advise close follow-up with plastic surgery.  You have been given a referral to Dr. Ulice Bold; call to make an appointment for follow up.  We recommend swishing with warm water after every meal to prevent particles from becoming stuck in your lip laceration.  Your stitches will dissolve on their own and do not need to be removed.  Follow-up with your dentist for further evaluation of dental trauma.  We advise a soft diet until you are able to see a specialist.  Do not bite or chew food.  Apply bacitracin to your wounds twice daily as prescribed.  You may take ibuprofen for management of any facial pain or headache.  Follow-up with your primary doctor as needed.  Return for new or concerning symptoms.

## 2018-09-27 NOTE — ED Triage Notes (Signed)
Patient reports syncopal episode this evening , presents with abrasions at left knee/right hand , left upper face and lower lip swelling swelling , alert and oriented at arrival , pt. admitted drinking ETOH , ambulatory/alert and oriented.

## 2018-09-27 NOTE — ED Provider Notes (Signed)
[facility] EMERGENCY DEPARTMENT Provider Note   CSN: [id number] Arrival date & time: 09/27/18  0357     History   Chief Complaint Chief Complaint  Patient presents with  . Syncope    HPI Janda Cargo is a 22 y.o. female.   22 year old female presents to the emergency department for evaluation of facial injuries.  Patient states that she was out drinking tequila tonight when she was walking back to her car.  She next recalls waking up on the ground.  She is complaining of facial pain as well as lip pain.  Laceration noted to the lower lip.  She notes loose upper dentition.  Has not taken any medications for her symptoms.  Cannot recall the date of her last tetanus shot.     Past Medical History:  Diagnosis Date  . Asthma     There are no active problems to display for this patient.   History reviewed. No pertinent surgical history.   OB History   None      Home Medications    Prior to Admission medications   Medication Sig Start Date End Date Taking? Authorizing Provider  ibuprofen (ADVIL,MOTRIN) 600 MG tablet Take 1 tablet (600 mg total) by mouth every 6 (six) hours as needed. 09/01/17   Elson Areas, PA-C  Pseudoephedrine-Guaifenesin (MUCINEX D MAX STRENGTH) [phone] MG TB12 Take 2 tablets by mouth every 6 (six) hours as needed. Cough    [provider]    Family History No family history on file.  Social History Social History   Tobacco Use  . Smoking status: Never Smoker  . Smokeless tobacco: Never Used  Substance Use Topics  . Alcohol use: Yes  . Drug use: Yes    Types: Marijuana     Allergies   Patient has no known allergies.   Review of Systems Review of Systems Ten systems reviewed and are negative for acute change, except as noted in the HPI.    Physical Exam Updated Vital Signs BP 130/80 (BP Location: Right Arm)   Pulse 83   Temp 98.1 F (36.7 C) (Oral)   Resp 19   Ht 5\' 2"  (1.575 m)   Wt 60.8 kg    SpO2 100%   BMI 24.51 kg/m   Physical Exam  Constitutional: She is oriented to person, place, and time. She appears well-developed and well-nourished. No distress.  Patient in NAD  HENT:  Head: Normocephalic. Head is with abrasion and with laceration. Head is without raccoon's eyes and without Battle's sign.    Nose:    Loose upper central incisors with chipped areas of left central incisor. No trismus. Speech is clear. Laceration to central inner lip; well approximated. There is a jagged laceration along the lower central vermilion border, 1cm in length.  Eyes: Conjunctivae and EOM are normal. No scleral icterus.  Neck: Normal range of motion.  Cardiovascular: Normal rate, regular rhythm and intact distal pulses.  Pulmonary/Chest: Effort normal. No stridor. No respiratory distress.  Respirations even and unlabored  Musculoskeletal: Normal range of motion.  Neurological: She is alert and oriented to person, place, and time. She exhibits normal muscle tone. Coordination normal.  Skin: Skin is warm and dry. No rash noted. She is not diaphoretic. No erythema. No pallor.  Psychiatric: She has a normal mood and affect. Her behavior is normal.  Nursing note and vitals reviewed.    ED Treatments / Results  Labs (all labs ordered are listed, but only abnormal  results are displayed) Labs Reviewed  I-STAT CHEM 8, ED - Abnormal; Notable for the following components:      Result Value   Potassium 3.3 (*)    Calcium, Ion 1.09 (*)    All other components within normal limits  I-STAT BETA HCG BLOOD, ED ([facility], [facility], [facility] ONLY)    EKG EKG Interpretation  Date/Time:  Tuesday September 27 2018 04:17:43 EDT Ventricular Rate:  71 PR Interval:    QRS Duration: 86 QT Interval:  376 QTC Calculation: 409 R Axis:   79 Text Interpretation:  Sinus rhythm ST elev, probable normal early repol pattern Confirmed by Ross Marcus ([id number]) on 09/27/2018 5:02:49 AM   Radiology Ct Soft Tissue Neck Wo  Contrast  Result Date: 09/27/2018 CLINICAL DATA:  Syncopal episode.  Facial pain. EXAM: CT NECK WITHOUT CONTRAST TECHNIQUE: Multidetector CT imaging of the neck was performed following the standard protocol without intravenous contrast. COMPARISON:  None. FINDINGS: PHARYNX AND LARYNX: Normal.  Widely patent airway. SALIVARY GLANDS: Normal. THYROID: Normal. LYMPH NODES: No lymphadenopathy by CT size criteria, decreased sensitivity without intravenous contrast. VASCULAR: Normal. LIMITED INTRACRANIAL: Normal. VISUALIZED ORBITS: Normal. MASTOIDS AND VISUALIZED PARANASAL SINUSES: Well-aerated. SKELETON: Nonacute. UPPER CHEST: Lung apices are clear. No superior mediastinal lymphadenopathy. OTHER: None. IMPRESSION: Negative CT neck without contrast. Electronically Signed   By: Awilda Metro M.D.   On: 09/27/2018 06:28   Ct C-spine No Charge  Result Date: 09/27/2018 CLINICAL DATA:  Trauma, syncope. EXAM: CT CERVICAL SPINE WITHOUT CONTRAST TECHNIQUE: Multidetector CT imaging of the cervical spine was performed without intravenous contrast. Multiplanar CT image reconstructions were also generated. Performed in conjunction with CT of the neck, reported separately. COMPARISON:  None. FINDINGS: Alignment: Normal. Skull base and vertebrae: No acute fracture. Vertebral body heights are maintained. The dens and skull base are intact. Soft tissues and spinal canal: No prevertebral fluid or swelling. No visible canal hematoma. Soft tissues also assessed on concurrent CT of the neck. Disc levels:  Normal. Upper chest: Negative. Other: None. IMPRESSION: No fracture or subluxation of the cervical spine. Electronically Signed   By: Narda Rutherford M.D.   On: 09/27/2018 06:29    Procedures Procedures (including critical care time)  LACERATION REPAIR Performed by: Antony Madura Authorized by: Antony Madura Consent: Verbal consent obtained. Risks and benefits: risks, benefits and alternatives were discussed Consent given  by: patient Patient identity confirmed: provided demographic data Prepped and Draped in normal sterile fashion Wound explored  Laceration Location: lower lip  Laceration Length: 1cm  No Foreign Bodies seen or palpated  Anesthesia: local infiltration  Local anesthetic: marcaine 0.5% with epinephrine  Anesthetic total: 3.6 ml  Irrigation method: syringe Amount of cleaning: standard  Skin closure: 6-0 chromic  Number of sutures: 5 (4 to external lip, 1 to internal lip)  Technique: simple interrupted  Patient tolerance: Patient tolerated the procedure well with no immediate complications.   Medications Ordered in ED Medications  bupivacaine-epinephrine (MARCAINE W/ EPI) 0.5% -1:200000 injection 3.6 mL (3.6 mLs Infiltration Given 09/27/18 0521)  Tdap (BOOSTRIX) injection 0.5 mL (0.5 mLs Intramuscular Given 09/27/18 0520)     Initial Impression / Assessment and Plan / ED Course  I have reviewed the triage vital signs and the nursing notes.  Pertinent labs & imaging results that were available during my care of the patient were reviewed by me and considered in my medical decision making (see chart for details).     22 year old female presents to the emergency department for evaluation of loss  of consciousness.  She was drinking tequila earlier tonight.  Arrived to the emergency department clinically sober.  She has multiple abrasions to her face as well as an abrasion and laceration to her lower lip.  This was repaired in the emergency department.  Tetanus updated.  Laboratory work-up today is reassuring.  EKG is nonischemic.  She had a soft tissue neck CT to evaluate lower facial structures and cervical spine.  CT scan today is reassuring, without evidence of acute traumatic injury.  Plan for outpatient supportive management.  She will require follow-up with a dentist for obvious dental trauma.  Have counseled on the need for a soft diet.  Will also refer to plastic surgery  given complexity of lower lip laceration.  Return precautions discussed and provided. Patient discharged in stable condition with no unaddressed concerns.   Final Clinical Impressions(s) / ED Diagnoses   Final diagnoses:  Syncope, unspecified syncope type  Laceration of lower lip, initial encounter  Facial abrasion, initial encounter  Dental trauma, initial encounter    ED Discharge Orders    None       Antony Madura, PA-C 09/27/18 1610    Shon Baton, MD 09/28/18 [phone]

## 2018-10-31 ENCOUNTER — Encounter (HOSPITAL_BASED_OUTPATIENT_CLINIC_OR_DEPARTMENT_OTHER): Payer: Self-pay | Admitting: *Deleted

## 2018-10-31 ENCOUNTER — Emergency Department (HOSPITAL_BASED_OUTPATIENT_CLINIC_OR_DEPARTMENT_OTHER)
Admission: EM | Admit: 2018-10-31 | Discharge: 2018-10-31 | Disposition: A | Discharge status: Home / Self Care | Payer: BLUE CROSS/BLUE SHIELD | Attending: Emergency Medicine | Admitting: Emergency Medicine

## 2018-10-31 ENCOUNTER — Other Ambulatory Visit: Payer: Self-pay

## 2018-10-31 ENCOUNTER — Emergency Department (HOSPITAL_BASED_OUTPATIENT_CLINIC_OR_DEPARTMENT_OTHER): Payer: BLUE CROSS/BLUE SHIELD

## 2018-10-31 DIAGNOSIS — N739 Female pelvic inflammatory disease, unspecified: Secondary | ICD-10-CM | POA: Insufficient documentation

## 2018-10-31 DIAGNOSIS — A599 Trichomoniasis, unspecified: Secondary | ICD-10-CM | POA: Diagnosis not present

## 2018-10-31 DIAGNOSIS — R1084 Generalized abdominal pain: Secondary | ICD-10-CM | POA: Diagnosis not present

## 2018-10-31 DIAGNOSIS — R109 Unspecified abdominal pain: Secondary | ICD-10-CM

## 2018-10-31 DIAGNOSIS — K828 Other specified diseases of gallbladder: Secondary | ICD-10-CM | POA: Diagnosis not present

## 2018-10-31 DIAGNOSIS — N730 Acute parametritis and pelvic cellulitis: Secondary | ICD-10-CM

## 2018-10-31 DIAGNOSIS — R1011 Right upper quadrant pain: Secondary | ICD-10-CM | POA: Diagnosis not present

## 2018-10-31 LAB — COMPREHENSIVE METABOLIC PANEL
ALBUMIN: 3.7 g/dL (ref 3.5–5.0)
ALK PHOS: 45 U/L (ref 38–126)
ALT: 16 U/L (ref 0–44)
AST: 26 U/L (ref 15–41)
Anion gap: 11 (ref 5–15)
BILIRUBIN TOTAL: 0.6 mg/dL (ref 0.3–1.2)
BUN: 10 mg/dL (ref 6–20)
CALCIUM: 8.7 mg/dL — AB (ref 8.9–10.3)
CO2: 26 mmol/L (ref 22–32)
CREATININE: 0.78 mg/dL (ref 0.44–1.00)
Chloride: 97 mmol/L — ABNORMAL LOW (ref 98–111)
GFR calc Af Amer: >60 mL/min (ref >60)
GFR calc non Af Amer: >60 mL/min (ref >60)
Glucose, Bld: 94 mg/dL (ref 70–99)
Potassium: 3.2 mmol/L — ABNORMAL LOW (ref 3.5–5.1)
SODIUM: 134 mmol/L — AB (ref 135–145)
TOTAL PROTEIN: 6.8 g/dL (ref 6.5–8.1)

## 2018-10-31 LAB — PREGNANCY, URINE: Preg Test, Ur: NEGATIVE

## 2018-10-31 LAB — URINALYSIS, MICROSCOPIC (REFLEX)

## 2018-10-31 LAB — URINALYSIS, ROUTINE W REFLEX MICROSCOPIC
Bilirubin Urine: NEGATIVE
GLUCOSE, UA: NEGATIVE mg/dL
KETONES UR: NEGATIVE mg/dL
Nitrite: NEGATIVE
PROTEIN: NEGATIVE mg/dL
Specific Gravity, Urine: 1.025 (ref 1.005–1.030)
pH: 6 (ref 5.0–8.0)

## 2018-10-31 LAB — CBC WITH DIFFERENTIAL/PLATELET
Abs Immature Granulocytes: 0.02 10*3/uL (ref 0.00–0.07)
Basophils Absolute: 0 10*3/uL (ref 0.0–0.1)
Basophils Relative: 0 %
EOS PCT: 0 %
Eosinophils Absolute: 0 10*3/uL (ref 0.0–0.5)
HCT: 41.3 % (ref 36.0–46.0)
Hemoglobin: 13.1 g/dL (ref 12.0–15.0)
Immature Granulocytes: 0 %
Lymphocytes Relative: 23 %
Lymphs Abs: 1 10*3/uL (ref 0.7–4.0)
MCH: 28.1 pg (ref 26.0–34.0)
MCHC: 31.7 g/dL (ref 30.0–36.0)
MCV: 88.6 fL (ref 80.0–100.0)
MONO ABS: 0.4 10*3/uL (ref 0.1–1.0)
MONOS PCT: 9 %
Neutro Abs: 3.1 10*3/uL (ref 1.7–7.7)
Neutrophils Relative %: 68 %
PLATELETS: 152 10*3/uL (ref 150–400)
RBC: 4.66 MIL/uL (ref 3.87–5.11)
RDW: 14.6 % (ref 11.5–15.5)
WBC: 4.6 10*3/uL (ref 4.0–10.5)
nRBC: 0 % (ref 0.0–0.2)

## 2018-10-31 LAB — WET PREP, GENITAL
SPERM: NONE SEEN
Yeast Wet Prep HPF POC: NONE SEEN

## 2018-10-31 LAB — LIPASE, BLOOD: Lipase: 28 U/L (ref 11–51)

## 2018-10-31 MED ORDER — MORPHINE SULFATE (PF) 4 MG/ML IV SOLN
4.0000 mg | Freq: Once | INTRAVENOUS | Status: AC
  Administered 2018-10-31: 4 mg via INTRAVENOUS
  Filled 2018-10-31: qty 1

## 2018-10-31 MED ORDER — SODIUM CHLORIDE 0.9 % IV BOLUS
1000.0000 mL | Freq: Once | INTRAVENOUS | Status: AC
  Administered 2018-10-31: 1000 mL via INTRAVENOUS

## 2018-10-31 MED ORDER — IOPAMIDOL (ISOVUE-300) INJECTION 61%
100.0000 mL | Freq: Once | INTRAVENOUS | Status: AC | PRN
  Administered 2018-10-31: 100 mL via INTRAVENOUS

## 2018-10-31 MED ORDER — METRONIDAZOLE 500 MG PO TABS
2000.0000 mg | ORAL_TABLET | Freq: Once | ORAL | Status: AC
  Administered 2018-10-31: 2000 mg via ORAL
  Filled 2018-10-31: qty 4

## 2018-10-31 MED ORDER — DOXYCYCLINE HYCLATE 100 MG PO CAPS: 100.0000 mg | ORAL_CAPSULE | Freq: Two times a day (BID) | ORAL | 0 refills | Status: DC

## 2018-10-31 MED ORDER — HYDROMORPHONE HCL 1 MG/ML IJ SOLN
INTRAMUSCULAR | Status: AC
  Administered 2018-10-31: 0.5 mg
  Filled 2018-10-31: qty 1

## 2018-10-31 MED ORDER — HYDROMORPHONE HCL 1 MG/ML IJ SOLN
1.0000 mg | Freq: Once | INTRAMUSCULAR | Status: AC
  Administered 2018-10-31: 1 mg via INTRAVENOUS
  Filled 2018-10-31: qty 1

## 2018-10-31 MED ORDER — CEFTRIAXONE SODIUM 250 MG IJ SOLR
250.0000 mg | Freq: Once | INTRAMUSCULAR | Status: AC
  Administered 2018-10-31: 250 mg via INTRAMUSCULAR
  Filled 2018-10-31: qty 250

## 2018-10-31 NOTE — ED Provider Notes (Signed)
[facility] EMERGENCY DEPARTMENT Provider Note   CSN: [id number] Arrival date & time: 10/31/18  1326     History   Chief Complaint Chief Complaint  Patient presents with  . Abdominal Pain    HPI Wanda Guzman is a 22 y.o. female.  The history is provided by the patient and medical records. No language interpreter was used.  Abdominal Pain   Pertinent negatives include diarrhea, nausea, vomiting and constipation.   Wanda Guzman is a 22 y.o. female  with no pertinent PMH who presents to the Emergency Department complaining of middle and left upper abdominal pain which began yesterday. Pain has been constant, worse with certain movements. Denies hx of similar sxs. She took OTC pain reliever with no improvement.  No n/v/d, constipation, blood in the stool, fever. She is sexually active. Denies vaginal discharge.   Past Medical History:  Diagnosis Date  . Asthma     There are no active problems to display for this patient.   History reviewed. No pertinent surgical history.   OB History   None      Home Medications    Prior to Admission medications   Medication Sig Start Date End Date Taking? Authorizing Provider  ibuprofen (ADVIL,MOTRIN) 600 MG tablet Take 1 tablet (600 mg total) by mouth every 6 (six) hours as needed for headache, mild pain or moderate pain. 09/27/18  Yes Antony Madura, PA-C  bacitracin ointment Apply 1 application topically 2 (two) times daily. Apply to wounds as prescribed to prevent infection. 09/27/18   Antony Madura, PA-C  doxycycline (VIBRAMYCIN) 100 MG capsule Take 1 capsule (100 mg total) by mouth 2 (two) times daily. 10/31/18   Shakeela Rabadan, Chase Picket, PA-C  Pseudoephedrine-Guaifenesin (MUCINEX D MAX STRENGTH) [phone] MG TB12 Take 2 tablets by mouth every 6 (six) hours as needed. Cough    [provider]    Family History No family history on file.  Social History Social History   Tobacco Use  . Smoking status: Never Smoker    . Smokeless tobacco: Never Used  Substance Use Topics  . Alcohol use: Yes  . Drug use: Yes    Types: Marijuana     Allergies   Patient has no known allergies.   Review of Systems Review of Systems  Gastrointestinal: Positive for abdominal pain. Negative for blood in stool, constipation, diarrhea, nausea and vomiting.  All other systems reviewed and are negative.    Physical Exam Updated Vital Signs BP 117/71 (BP Location: Left Arm)   Pulse 75   Temp 98.6 F (37 C) (Oral)   Resp 12   Ht 5\' 2"  (1.575 m)   Wt 59 kg   LMP 10/19/2018   SpO2 99%   BMI 23.78 kg/m   Physical Exam  Constitutional: She is oriented to person, place, and time. She appears well-developed and well-nourished. No distress.  HENT:  Head: Normocephalic and atraumatic.  Neck: Neck supple.  Cardiovascular: Normal rate, regular rhythm and normal heart sounds.  No murmur heard. Pulmonary/Chest: Effort normal and breath sounds normal. No respiratory distress.  Abdominal: Soft. Bowel sounds are normal. She exhibits no distension.  Diffuse tenderness most significant in the RUQ.   Genitourinary:  Genitourinary Comments: Large amount of white discharge. + CMT.   Neurological: She is alert and oriented to person, place, and time.  Skin: Skin is warm and dry.  Nursing note and vitals reviewed.    ED Treatments / Results  Labs (all labs ordered are listed, but  only abnormal results are displayed) Labs Reviewed  WET PREP, GENITAL - Abnormal; Notable for the following components:      Result Value   Trich, Wet Prep PRESENT (*)    Clue Cells Wet Prep HPF POC PRESENT (*)    WBC, Wet Prep HPF POC MANY (*)    All other components within normal limits  URINALYSIS, ROUTINE W REFLEX MICROSCOPIC - Abnormal; Notable for the following components:   APPearance CLOUDY (*)    Hgb urine dipstick TRACE (*)    Leukocytes, UA TRACE (*)    All other components within normal limits  URINALYSIS, MICROSCOPIC (REFLEX)  - Abnormal; Notable for the following components:   Bacteria, UA MANY (*)    All other components within normal limits  COMPREHENSIVE METABOLIC PANEL - Abnormal; Notable for the following components:   Sodium 134 (*)    Potassium 3.2 (*)    Chloride 97 (*)    Calcium 8.7 (*)    All other components within normal limits  PREGNANCY, URINE  LIPASE, BLOOD  CBC WITH DIFFERENTIAL/PLATELET  GC/CHLAMYDIA PROBE AMP ([place]) NOT AT [facility]    EKG None  Radiology Ct Abdomen Pelvis W Contrast  Result Date: 10/31/2018 CLINICAL DATA:  Mid to left abdominal pain EXAM: CT ABDOMEN AND PELVIS WITH CONTRAST TECHNIQUE: Multidetector CT imaging of the abdomen and pelvis was performed using the standard protocol following bolus administration of intravenous contrast. CONTRAST:  ISOVUE-300 IOPAMIDOL (ISOVUE-300) INJECTION 61% COMPARISON:  Ultrasound 10/31/2018, CT 10/06/2015 FINDINGS: Lower chest: Lung bases demonstrate no acute consolidation or effusion. The heart size is within normal limits. Hepatobiliary: No focal liver abnormality is seen. No gallstones, gallbladder wall thickening, or biliary dilatation. Pancreas: Unremarkable. No pancreatic ductal dilatation or surrounding inflammatory changes. Spleen: Normal in size without focal abnormality. Adrenals/Urinary Tract: Adrenal glands are unremarkable. Kidneys are normal, without renal calculi, focal lesion, or hydronephrosis. Bladder is unremarkable. Stomach/Bowel: The stomach is nonenlarged. Mild gaseous prominence of jejunum without evidence for a bowel obstruction. Mild fluid in the cecum. The appendix is not well identified. Vascular/Lymphatic: Nonaneurysmal aorta. Prominent left inguinal lymph node measuring up to 11 mm in size. Reproductive: Uterus unremarkable.  No adnexal mass. Other: No free air. Small amount of free fluid within the pelvis. Slightly indistinct and hazy appearance of the pelvic fat. Musculoskeletal: No acute or significant  osseous findings. IMPRESSION: 1. Small amount of free fluid in the pelvis. Slightly hazy and indistinct appearance of the pelvic fat suggesting nonspecific pelvic inflammatory process. No abscess. 2. Otherwise no definite CT evidence for acute intra-abdominal or pelvic abnormality. Electronically Signed   By: Jasmine Pang M.D.   On: 10/31/2018 17:50   US Abdomen Limited Ruq  Result Date: 10/31/2018 CLINICAL DATA:  Abdominal pain starting yesterday EXAM: ULTRASOUND ABDOMEN LIMITED RIGHT UPPER QUADRANT COMPARISON:  CT abdomen 10/06/2015 FINDINGS: Gallbladder: No gallstones or wall thickening visualized. Mildly contracted gallbladder. No sonographic Murphy sign noted by sonographer. Common bile duct: Diameter: 3 mm Liver: No focal lesion identified. Within normal limits in parenchymal echogenicity. Portal vein is patent on color Doppler imaging with normal direction of blood flow towards the liver. IMPRESSION: 1. Mildly contracted gallbladder. No significant abnormality is sonographically apparent. Electronically Signed   By: Gaylyn Rong M.D.   On: 10/31/2018 16:21    Procedures Procedures (including critical care time)  Medications Ordered in ED Medications  sodium chloride 0.9 % bolus 1,000 mL (0 mLs Intravenous Stopped 10/31/18 1602)  morphine 4 MG/ML injection 4 mg (  4 mg Intravenous Given 10/31/18 1457)  HYDROmorphone (DILAUDID) 1 MG/ML injection (0.5 mg  Given 10/31/18 1537)  iopamidol (ISOVUE-300) 61 % injection 100 mL (100 mLs Intravenous Contrast Given 10/31/18 1712)  HYDROmorphone (DILAUDID) injection 1 mg (1 mg Intravenous Given 10/31/18 1727)  cefTRIAXone (ROCEPHIN) injection 250 mg (250 mg Intramuscular Given 10/31/18 1853)  metroNIDAZOLE (FLAGYL) tablet 2,000 mg (2,000 mg Oral Given 10/31/18 1911)     Initial Impression / Assessment and Plan / ED Course  I have reviewed the triage vital signs and the nursing notes.  Pertinent labs & imaging results that were available during my  care of the patient were reviewed by me and considered in my medical decision making (see chart for details).    Wanda Guzman is a 22 y.o. female who presents to ED for central abdominal pain. On exam, patient is afebrile, hemodynamically stable with diffuse abdominal tenderness. Normal white count on labs. Liver enzymes and kidney function wdl. Her tenderness is most significant in the RUQ. Ultrasound was obtained which was negative. Given the amount of pain she seems to be in and significant diffuse tenderness, will proceed further to CT imaging. CT does show small amount of free fluid in the pelvis with slightly hazy and indistinct appearance of the pelvic fat suggestive of PID. No TOA seen. Pelvic exam with significant amount of discharge. She did have CMT as well. Wet prep + for trich, clue cells and many wbc's. UA with trace leuks, 11-20 wbc's and many bacteria. Given pelvic exam and wet prep results, UA results likely 2/2 vaginitis. Given 2g flagyl for trich. Will treat for PID. Given Rocephin ED and discharged home on doxy. Needs to follow up with OBGYN. Follow up care and return precautions discussed with patient and mother at bedside. Return precautions discussed and all questions answered.    Final Clinical Impressions(s) / ED Diagnoses   Final diagnoses:  Abdominal pain  PID (acute pelvic inflammatory disease)  Trichimoniasis    ED Discharge Orders         Ordered    doxycycline (VIBRAMYCIN) 100 MG capsule  2 times daily     10/31/18 1914           Wanda Guzman, Chase Picket, PA-C 10/31/18 1934    Derwood Kaplan, MD 11/02/18 [phone]

## 2018-10-31 NOTE — Discharge Instructions (Addendum)
It was my pleasure taking care of you today!   Please take all of your antibiotics until finished!  You will need to follow up with an OBGYN. If you do not have one, you can follow up with the clinic listed. Call them to schedule a follow up appointment.   Return to ER for fever, vomiting, worsening abdominal pain, new symptoms, any additional concerns.

## 2018-10-31 NOTE — ED Triage Notes (Signed)
Mid abdominal pain since yesterday. Pain goes into her lateral left side. Denies constipation.

## 2018-11-01 ENCOUNTER — Emergency Department (HOSPITAL_BASED_OUTPATIENT_CLINIC_OR_DEPARTMENT_OTHER)
Admission: EM | Admit: 2018-11-01 | Discharge: 2018-11-01 | Disposition: A | Discharge status: Home / Self Care | Payer: BLUE CROSS/BLUE SHIELD | Attending: Emergency Medicine | Admitting: Emergency Medicine

## 2018-11-01 ENCOUNTER — Encounter (HOSPITAL_BASED_OUTPATIENT_CLINIC_OR_DEPARTMENT_OTHER): Payer: Self-pay | Admitting: *Deleted

## 2018-11-01 ENCOUNTER — Other Ambulatory Visit: Payer: Self-pay

## 2018-11-01 DIAGNOSIS — J45909 Unspecified asthma, uncomplicated: Secondary | ICD-10-CM | POA: Diagnosis not present

## 2018-11-01 DIAGNOSIS — R1013 Epigastric pain: Secondary | ICD-10-CM | POA: Insufficient documentation

## 2018-11-01 DIAGNOSIS — Z79899 Other long term (current) drug therapy: Secondary | ICD-10-CM | POA: Diagnosis not present

## 2018-11-01 DIAGNOSIS — R112 Nausea with vomiting, unspecified: Secondary | ICD-10-CM | POA: Insufficient documentation

## 2018-11-01 DIAGNOSIS — R1012 Left upper quadrant pain: Secondary | ICD-10-CM | POA: Diagnosis not present

## 2018-11-01 LAB — GC/CHLAMYDIA PROBE AMP (~~LOC~~) NOT AT ARMC
Chlamydia: NEGATIVE
NEISSERIA GONORRHEA: NEGATIVE

## 2018-11-01 MED ORDER — SODIUM CHLORIDE 0.9 % IV BOLUS
1000.0000 mL | Freq: Once | INTRAVENOUS | Status: AC
  Administered 2018-11-01: 1000 mL via INTRAVENOUS

## 2018-11-01 MED ORDER — KETOROLAC TROMETHAMINE 30 MG/ML IJ SOLN
30.0000 mg | Freq: Once | INTRAMUSCULAR | Status: AC
  Administered 2018-11-01: 30 mg via INTRAVENOUS
  Filled 2018-11-01: qty 1

## 2018-11-01 MED ORDER — ACETAMINOPHEN 325 MG PO TABS
650.0000 mg | ORAL_TABLET | Freq: Once | ORAL | Status: AC
  Administered 2018-11-01: 650 mg via ORAL
  Filled 2018-11-01: qty 2

## 2018-11-01 MED ORDER — MORPHINE SULFATE (PF) 4 MG/ML IV SOLN
4.0000 mg | Freq: Once | INTRAVENOUS | Status: AC
  Administered 2018-11-01: 4 mg via INTRAVENOUS
  Filled 2018-11-01: qty 1

## 2018-11-01 MED ORDER — ONDANSETRON HCL 4 MG/2ML IJ SOLN
4.0000 mg | Freq: Once | INTRAMUSCULAR | Status: AC
  Administered 2018-11-01: 4 mg via INTRAVENOUS
  Filled 2018-11-01: qty 2

## 2018-11-01 NOTE — ED Provider Notes (Signed)
[facility] EMERGENCY DEPARTMENT Provider Note   CSN: [id number] Arrival date & time: 11/01/18  1613     History   Chief Complaint Chief Complaint  Patient presents with  . Abdominal Pain    HPI Wanda Guzman is a 22 y.o. female is here for evaluation of nausea, vomiting and abdominal pain.  Described as severe.  Constant.  Abdominal pain is worse at epigastrium and left upper quadrant.  Mother found patient vomiting in the toilet today and became concerned.  They were seen in the ER yesterday for similar symptoms.  Patient had extensive ER work-up including CT A/P, pelvic ultrasound, pelvic exam.  She tested positive for trichomonas.  Pelvic ultrasound was negative.  CT A/P showed findings consistent with PID.  Urinalysis had some WBCs and bacteria but thought to be from vaginal discharge as she had no UTI symptoms.  Patient has been compliant with doxycycline today and took 1 Percocet.  She has not taken any anti-inflammatories.  Has not used Zofran.  No fevers, cough, dysuria, hematuria.  HPI  Past Medical History:  Diagnosis Date  . Asthma     There are no active problems to display for this patient.   History reviewed. No pertinent surgical history.   OB History   None      Home Medications    Prior to Admission medications   Medication Sig Start Date End Date Taking? Authorizing Provider  bacitracin ointment Apply 1 application topically 2 (two) times daily. Apply to wounds as prescribed to prevent infection. 09/27/18   Antony Madura, PA-C  doxycycline (VIBRAMYCIN) 100 MG capsule Take 1 capsule (100 mg total) by mouth 2 (two) times daily. 10/31/18   Ward, Chase Picket, PA-C  ibuprofen (ADVIL,MOTRIN) 600 MG tablet Take 1 tablet (600 mg total) by mouth every 6 (six) hours as needed for headache, mild pain or moderate pain. 09/27/18   Antony Madura, PA-C  Pseudoephedrine-Guaifenesin (MUCINEX D MAX STRENGTH) [phone] MG TB12 Take 2 tablets by mouth every 6 (six)  hours as needed. Cough    [provider]    Family History No family history on file.  Social History Social History   Tobacco Use  . Smoking status: Never Smoker  . Smokeless tobacco: Never Used  Substance Use Topics  . Alcohol use: Yes  . Drug use: Yes    Types: Marijuana     Allergies   Patient has no known allergies.   Review of Systems Review of Systems  Gastrointestinal: Positive for abdominal pain, nausea and vomiting.  All other systems reviewed and are negative.    Physical Exam Updated Vital Signs BP (!) 118/57 (BP Location: Left Arm)   Pulse 62   Temp 99 F (37.2 C) (Oral)   Resp 16   Ht 5\' 2"  (1.575 m)   Wt 58.9 kg   LMP 10/19/2018 Comment: (-) urine pregnancy//a.c.  SpO2 100%   BMI 23.75 kg/m   Physical Exam  Constitutional: She is oriented to person, place, and time. She appears well-developed and well-nourished.  Nontoxic.  Keeps her eyes closed during exam.  HENT:  Head: Normocephalic and atraumatic.  Nose: Nose normal.  Eyes: Pupils are equal, round, and reactive to light. Conjunctivae and EOM are normal.  Neck: Normal range of motion.  Cardiovascular: Normal rate and regular rhythm.  Pulmonary/Chest: Effort normal and breath sounds normal.  Abdominal: Soft. Bowel sounds are normal. There is generalized tenderness and tenderness in the epigastric area and left upper quadrant.  No G/R/R. No suprapubic or CVA tenderness. Negative Murphy's and McBurney's. Active BS to lower quadrants.   Musculoskeletal: Normal range of motion.  Neurological: She is alert and oriented to person, place, and time.  Skin: Skin is warm and dry. Capillary refill takes less than 2 seconds.  Psychiatric: She has a normal mood and affect. Her behavior is normal.  Nursing note and vitals reviewed.    ED Treatments / Results  Labs (all labs ordered are listed, but only abnormal results are displayed) Labs Reviewed - No data to  display  EKG None  Radiology Ct Abdomen Pelvis W Contrast  Result Date: 10/31/2018 CLINICAL DATA:  Mid to left abdominal pain EXAM: CT ABDOMEN AND PELVIS WITH CONTRAST TECHNIQUE: Multidetector CT imaging of the abdomen and pelvis was performed using the standard protocol following bolus administration of intravenous contrast. CONTRAST:  ISOVUE-300 IOPAMIDOL (ISOVUE-300) INJECTION 61% COMPARISON:  Ultrasound 10/31/2018, CT 10/06/2015 FINDINGS: Lower chest: Lung bases demonstrate no acute consolidation or effusion. The heart size is within normal limits. Hepatobiliary: No focal liver abnormality is seen. No gallstones, gallbladder wall thickening, or biliary dilatation. Pancreas: Unremarkable. No pancreatic ductal dilatation or surrounding inflammatory changes. Spleen: Normal in size without focal abnormality. Adrenals/Urinary Tract: Adrenal glands are unremarkable. Kidneys are normal, without renal calculi, focal lesion, or hydronephrosis. Bladder is unremarkable. Stomach/Bowel: The stomach is nonenlarged. Mild gaseous prominence of jejunum without evidence for a bowel obstruction. Mild fluid in the cecum. The appendix is not well identified. Vascular/Lymphatic: Nonaneurysmal aorta. Prominent left inguinal lymph node measuring up to 11 mm in size. Reproductive: Uterus unremarkable.  No adnexal mass. Other: No free air. Small amount of free fluid within the pelvis. Slightly indistinct and hazy appearance of the pelvic fat. Musculoskeletal: No acute or significant osseous findings. IMPRESSION: 1. Small amount of free fluid in the pelvis. Slightly hazy and indistinct appearance of the pelvic fat suggesting nonspecific pelvic inflammatory process. No abscess. 2. Otherwise no definite CT evidence for acute intra-abdominal or pelvic abnormality. Electronically Signed   By: Jasmine Pang M.D.   On: 10/31/2018 17:50   US Abdomen Limited Ruq  Result Date: 10/31/2018 CLINICAL DATA:  Abdominal pain starting  yesterday EXAM: ULTRASOUND ABDOMEN LIMITED RIGHT UPPER QUADRANT COMPARISON:  CT abdomen 10/06/2015 FINDINGS: Gallbladder: No gallstones or wall thickening visualized. Mildly contracted gallbladder. No sonographic Murphy sign noted by sonographer. Common bile duct: Diameter: 3 mm Liver: No focal lesion identified. Within normal limits in parenchymal echogenicity. Portal vein is patent on color Doppler imaging with normal direction of blood flow towards the liver. IMPRESSION: 1. Mildly contracted gallbladder. No significant abnormality is sonographically apparent. Electronically Signed   By: Gaylyn Rong M.D.   On: 10/31/2018 16:21    Procedures Procedures (including critical care time)  Medications Ordered in ED Medications  sodium chloride 0.9 % bolus 1,000 mL ( Intravenous Stopped 11/01/18 1850)  ondansetron (ZOFRAN) injection 4 mg (4 mg Intravenous Given 11/01/18 1745)  ketorolac (TORADOL) 30 MG/ML injection 30 mg (30 mg Intravenous Given 11/01/18 1745)  morphine 4 MG/ML injection 4 mg (4 mg Intravenous Given 11/01/18 1745)  acetaminophen (TYLENOL) tablet 650 mg (650 mg Oral Given 11/01/18 1749)     Initial Impression / Assessment and Plan / ED Course  I have reviewed the triage vital signs and the nursing notes.  Pertinent labs & imaging results that were available during my care of the patient were reviewed by me and considered in my medical decision making (see chart for details).  Clinical Course as of Nov 01 1913  Tue Nov 01, 2018  1610 I reevaluated patient.  She is sitting up and eating.  States she feels a lot better.  No emesis in the ER.  She has finished IV fluids.  VS WNL.   [CG]    Clinical Course User Index [CG] Liberty Handy, PA-C   Symptoms most likely from inflammatory process secondary to PID.  I reviewed patient's ER visit yesterday including labs, urinalysis, wet prep, CT A/P and pelvic ultrasound.  She does not have suprapubic or CVA tenderness.  Her  abdominal tenderness is generalized worse at the epigastrium.  Doubt pyelonephritis, renal stone.  Did consider pelvic abscess however she has no fever and CT A/P yesterday only with small fluid collection in the pelvis and unlikely for abscess to form since then.  We will defer repeat labs today and will attempt to control symptoms.  Patient and mother are comfortable with this plan.  1915: Patient feels a lot better after medications.  She is tolerating p.o.  She is ambulatory in the room without any difficulty.  Stressed importance of anti-inflammatories for PID.  Encouraged scheduled Zofran to prevent future nausea/vomiting and more dehydration.  On repeat abdominal exam she had minimal nonfocal lower abdominal tenderness without peritonitis.  No CVA tenderness.  At this time, given recent extensive ER work-up yesterday and significant improvement in symptoms, normal vital signs, patient is deemed appropriate for discharge.  Mother and patient are aware that PID can always worsen and cause abscess, aware of symptoms to warrant return to the ER.  Patient and mother are in agreement and comfortable with the plan. Final Clinical Impressions(s) / ED Diagnoses   Final diagnoses:  Nausea and vomiting in adult patient  Epigastric abdominal pain    ED Discharge Orders    None       Jerrell Mylar 11/01/18 1914    Little, Ambrose Finland, MD 11/01/18 2259

## 2018-11-01 NOTE — Discharge Instructions (Signed)
You were seen in the ER for continued abdominal pain, nausea, vomiting.  You had extensive work-up in the ER yesterday.  I think your symptoms are from an inflammatory process from pelvic inflammatory disease that is causing your pain.  Anti-inflammatories such as ibuprofen and acetaminophen are key in treating your PID.  You can take up to 600 mg of ibuprofen and 500 to 1000 mg of acetaminophen every 6 hours for the next 3 to 5 days for inflammation and pain.  Take Zofran scheduled every 6 hours to prevent continued nausea and vomiting.  Stay well-hydrated.  Continue antibiotics.  Return to the ER for fever greater than 100.4F, blood in your vomit, blood in your stool, localized abdominal pain to your right upper or right lower abdomen, worsening or persistent abdominal pain despite anti-inflammatories.  Pelvic inflammatory disease can always worsen and cause of pelvic abscess or pocket of pus, return if there is worsening or persistent pain despite anti-inflammatories.

## 2018-11-01 NOTE — ED Triage Notes (Signed)
Mid abdominal pain x 2 days. Pain goes into her lateral left side. She was seen for same yesterday. Vomited today.

## 2018-12-02 DIAGNOSIS — G43719 Chronic migraine without aura, intractable, without status migrainosus: Secondary | ICD-10-CM | POA: Diagnosis not present

## 2018-12-09 ENCOUNTER — Telehealth: Payer: Self-pay | Admitting: *Deleted

## 2018-12-09 NOTE — Telephone Encounter (Signed)
Referral sent to scheduling and notes on file. °

## 2019-01-18 DIAGNOSIS — Z113 Encounter for screening for infections with a predominantly sexual mode of transmission: Secondary | ICD-10-CM | POA: Diagnosis not present

## 2019-01-18 DIAGNOSIS — Z114 Encounter for screening for human immunodeficiency virus [HIV]: Secondary | ICD-10-CM | POA: Diagnosis not present

## 2019-01-20 ENCOUNTER — Encounter: Payer: Self-pay | Admitting: Internal Medicine

## 2019-02-09 ENCOUNTER — Ambulatory Visit: Payer: BLUE CROSS/BLUE SHIELD | Admitting: Internal Medicine

## 2019-02-10 ENCOUNTER — Encounter: Payer: Self-pay | Admitting: Internal Medicine

## 2019-02-23 DIAGNOSIS — R05 Cough: Secondary | ICD-10-CM | POA: Diagnosis not present

## 2019-02-23 DIAGNOSIS — R112 Nausea with vomiting, unspecified: Secondary | ICD-10-CM | POA: Diagnosis not present

## 2019-02-23 DIAGNOSIS — J101 Influenza due to other identified influenza virus with other respiratory manifestations: Secondary | ICD-10-CM | POA: Diagnosis not present

## 2019-02-23 DIAGNOSIS — R509 Fever, unspecified: Secondary | ICD-10-CM | POA: Diagnosis not present

## 2019-02-23 DIAGNOSIS — R52 Pain, unspecified: Secondary | ICD-10-CM | POA: Diagnosis not present

## 2019-03-06 DIAGNOSIS — G43719 Chronic migraine without aura, intractable, without status migrainosus: Secondary | ICD-10-CM | POA: Diagnosis not present

## 2019-05-16 ENCOUNTER — Other Ambulatory Visit: Payer: Self-pay

## 2019-05-16 ENCOUNTER — Encounter (HOSPITAL_COMMUNITY): Payer: Self-pay | Admitting: Emergency Medicine

## 2019-05-16 ENCOUNTER — Ambulatory Visit (HOSPITAL_COMMUNITY)
Admission: EM | Admit: 2019-05-16 | Discharge: 2019-05-16 | Disposition: A | Discharge status: Home / Self Care | Payer: BLUE CROSS/BLUE SHIELD | Attending: Family Medicine | Admitting: Family Medicine

## 2019-05-16 DIAGNOSIS — Z202 Contact with and (suspected) exposure to infections with a predominantly sexual mode of transmission: Secondary | ICD-10-CM | POA: Diagnosis not present

## 2019-05-16 LAB — POCT PREGNANCY, URINE: Preg Test, Ur: NEGATIVE

## 2019-05-16 MED ORDER — AZITHROMYCIN 250 MG PO TABS
ORAL_TABLET | ORAL | Status: AC
  Filled 2019-05-16: qty 1

## 2019-05-16 MED ORDER — AZITHROMYCIN 250 MG PO TABS
1000.0000 mg | ORAL_TABLET | Freq: Once | ORAL | Status: AC
  Administered 2019-05-16: 1000 mg via ORAL

## 2019-05-16 MED ORDER — CEFTRIAXONE SODIUM 250 MG IJ SOLR
250.0000 mg | Freq: Once | INTRAMUSCULAR | Status: AC
  Administered 2019-05-16: 250 mg via INTRAMUSCULAR

## 2019-05-16 MED ORDER — LIDOCAINE HCL (PF) 1 % IJ SOLN
INTRAMUSCULAR | Status: AC
  Filled 2019-05-16: qty 2

## 2019-05-16 MED ORDER — CEFTRIAXONE SODIUM 250 MG IJ SOLR
INTRAMUSCULAR | Status: AC
  Filled 2019-05-16: qty 250

## 2019-05-16 NOTE — ED Triage Notes (Signed)
Patient reports her partner has burning and penile discharge.  Patient denies any symptoms, but requesting to be evaluated.

## 2019-05-16 NOTE — ED Provider Notes (Signed)
[facility]   [id number] 05/16/19 Arrival Time: 1258  ASSESSMENT & PLAN:  1. Possible exposure to STD    No s/s of PID.   Discharge Instructions     You have been given the following medications today for treatment of suspected gonorrhea and/or chlamydia:  cefTRIAXone (ROCEPHIN) injection 250 mg azithromycin (ZITHROMAX) tablet 1,000 mg  Even though we have treated you today, we have sent testing for sexually transmitted infections. We will notify you of any positive results once they are received. If required, we will prescribe any medications you might need.  Please refrain from all sexual activity for at least the next seven days.   Pending: Labs Reviewed  RPR  HIV ANTIBODY (ROUTINE TESTING W REFLEX)  POC URINE PREG, ED  CERVICOVAGINAL ANCILLARY ONLY   Will notify of any positive results. Instructed to refrain from sexual activity for at least seven days.  Reviewed expectations re: course of current medical issues. Questions answered. Outlined signs and symptoms indicating need for more acute intervention. Patient verbalized understanding. After Visit Summary given.   SUBJECTIVE:  Wanda Guzman is a 23 y.o. female who reports possible exposure to a STI. Reports her one female partner "had some discharge from his penis". He is here getting tested also. She reports no symptoms. No urinary frequency, dysuria, or vaginal discharge. Afebrile. No abdominal or pelvic pain. No n/v. No rashes or lesions. Sexually active with single female partner; without regular condom use. Reports h/o PID in 2019; treated.  Patient's last menstrual period was 05/05/2019. Requests UPT today.  ROS: As per HPI. All other systems negative.   OBJECTIVE:  Vitals:   05/16/19 1320  BP: 125/85  Pulse: 78  Resp: 18  Temp: 98.5 F (36.9 C)  TempSrc: Oral  SpO2: 97%     General appearance: alert, cooperative, appears stated age and no distress Throat: lips, mucosa, and tongue  normal; teeth and gums normal CV: RRR Lungs: CTAB Back: no CVA tenderness; FROM at waist Abdomen: soft, non-tender GU: deferred Skin: warm and dry Psychological: alert and cooperative; normal mood and affect.   Labs Reviewed  RPR  HIV ANTIBODY (ROUTINE TESTING W REFLEX)  POC URINE PREG, ED  CERVICOVAGINAL ANCILLARY ONLY    No Known Allergies  Past Medical History:  Diagnosis Date  . Asthma   . Chronic migraine w/o aura, not intractable, w/o stat migr    SMOKES   Family History  Problem Relation Age of Onset  . Hypertension Other   . Diabetes Mellitus I Other    Social History   Socioeconomic History  . Marital status: Single    Spouse name: Not on file  . Number of children: 0  . Years of education: Not on file  . Highest education level: Not on file  Occupational History  . Not on file  Social Needs  . Financial resource strain: Not on file  . Food insecurity:    Worry: Not on file    Inability: Not on file  . Transportation needs:    Medical: Not on file    Non-medical: Not on file  Tobacco Use  . Smoking status: Current Every Day Smoker  . Smokeless tobacco: Never Used  Substance and Sexual Activity  . Alcohol use: Yes  . Drug use: Yes    Types: Marijuana  . Sexual activity: Not on file  Lifestyle  . Physical activity:    Days per week: Not on file    Minutes per session: Not  on file  . Stress: Not on file  Relationships  . Social connections:    Talks on phone: Not on file    Gets together: Not on file    Attends religious service: Not on file    Active member of club or organization: Not on file    Attends meetings of clubs or organizations: Not on file    Relationship status: Not on file  . Intimate partner violence:    Fear of current or ex partner: Not on file    Emotionally abused: Not on file    Physically abused: Not on file    Forced sexual activity: Not on file  Other Topics Concern  . Not on file  Social History Narrative  .  Not on file          Mardella Layman, MD 05/16/19 1341

## 2019-05-16 NOTE — Discharge Instructions (Addendum)

## 2019-05-17 LAB — CERVICOVAGINAL ANCILLARY ONLY
Bacterial vaginitis: POSITIVE — AB
Candida vaginitis: POSITIVE — AB
Chlamydia: NEGATIVE
Neisseria Gonorrhea: NEGATIVE
Trichomonas: NEGATIVE

## 2019-05-17 LAB — RPR: RPR Ser Ql: NONREACTIVE

## 2019-05-17 LAB — HIV ANTIBODY (ROUTINE TESTING W REFLEX): HIV Screen 4th Generation wRfx: NONREACTIVE

## 2019-05-18 ENCOUNTER — Telehealth (HOSPITAL_COMMUNITY): Payer: Self-pay | Admitting: Emergency Medicine

## 2019-05-18 MED ORDER — FLUCONAZOLE 150 MG PO TABS: 150.0000 mg | ORAL_TABLET | Freq: Once | ORAL | 0 refills | Status: AC

## 2019-05-18 MED ORDER — METRONIDAZOLE 500 MG PO TABS: 500.0000 mg | ORAL_TABLET | Freq: Two times a day (BID) | ORAL | 0 refills | Status: AC

## 2019-05-18 NOTE — Telephone Encounter (Signed)
Bacterial vaginosis is positive. This was not treated at the urgent care visit.  Flagyl 500 mg BID x 7 days #14 no refills sent to patients pharmacy of choice.    Test for candida (yeast) was positive.  Prescription for fluconazole 150mg po now, repeat dose in 3d if needed, #2 no refills, sent to the pharmacy of record.  Recheck or followup with PCP for further evaluation if symptoms are not improving.    Patient contacted and made aware of all results, all questions answered.   

## 2019-07-18 ENCOUNTER — Other Ambulatory Visit: Payer: Self-pay

## 2019-07-18 ENCOUNTER — Encounter (HOSPITAL_COMMUNITY): Payer: Self-pay | Admitting: Emergency Medicine

## 2019-07-18 ENCOUNTER — Ambulatory Visit (HOSPITAL_COMMUNITY)
Admission: EM | Admit: 2019-07-18 | Discharge: 2019-07-18 | Disposition: A | Discharge status: Home / Self Care | Payer: BC Managed Care – PPO | Attending: Family Medicine | Admitting: Family Medicine

## 2019-07-18 DIAGNOSIS — N730 Acute parametritis and pelvic cellulitis: Secondary | ICD-10-CM

## 2019-07-18 DIAGNOSIS — Z202 Contact with and (suspected) exposure to infections with a predominantly sexual mode of transmission: Secondary | ICD-10-CM

## 2019-07-18 LAB — POCT URINALYSIS DIP (DEVICE)
Bilirubin Urine: NEGATIVE
Glucose, UA: NEGATIVE mg/dL
Hgb urine dipstick: NEGATIVE
Ketones, ur: NEGATIVE mg/dL
Leukocytes,Ua: NEGATIVE
Nitrite: NEGATIVE
Protein, ur: NEGATIVE mg/dL
Specific Gravity, Urine: >=1.03 (ref 1.005–1.030)
Urobilinogen, UA: 0.2 mg/dL (ref 0.0–1.0)
pH: 6 (ref 5.0–8.0)

## 2019-07-18 MED ORDER — METRONIDAZOLE 500 MG PO TABS: 500.0000 mg | ORAL_TABLET | Freq: Two times a day (BID) | ORAL | 0 refills | Status: AC

## 2019-07-18 MED ORDER — CEFTRIAXONE SODIUM 250 MG IJ SOLR
INTRAMUSCULAR | Status: AC
  Filled 2019-07-18: qty 250

## 2019-07-18 MED ORDER — CEFTRIAXONE SODIUM 250 MG IJ SOLR
250.0000 mg | Freq: Once | INTRAMUSCULAR | Status: AC
  Administered 2019-07-18: 250 mg via INTRAMUSCULAR

## 2019-07-18 MED ORDER — DOXYCYCLINE HYCLATE 100 MG PO CAPS: 100.0000 mg | ORAL_CAPSULE | Freq: Two times a day (BID) | ORAL | 0 refills | Status: DC

## 2019-07-18 MED ORDER — LIDOCAINE HCL (PF) 1 % IJ SOLN
INTRAMUSCULAR | Status: AC
  Filled 2019-07-18: qty 2

## 2019-07-18 NOTE — Discharge Instructions (Addendum)
Your STD tests are pending.  You were treated today with an injection of Rocephin.  You have 2 prescriptions to start taking, metronidazole and doxycycline.   Do not have sex for 7 to 10 days.  If your STDs tests come back positive, we will call you for any needed additional treatment and your sexual partner will need treatment.    Return here or go to the emergency department if you develop fever, chills, difficulty with urination, flank pain, back pain, other concerning symptoms.

## 2019-07-18 NOTE — ED Triage Notes (Signed)
Pt was assessed by Barkley Boards, NP

## 2019-07-18 NOTE — ED Provider Notes (Signed)
[facility]    CSN: [id number] Arrival date & time: 07/18/19  [id number]     History   Chief Complaint Chief Complaint  Patient presents with  . Vaginal Pain    HPI Wanda Guzman is a 23 y.o. female.   Patient presents with vaginal itching and irritation x several days.  She also reports small amount of yellow-white vaginal discharge and lower abd pain which is worse with sexual intercourse.  She denies fever, chills, dysuria, vomiting, diarrhea, flank pain, back pain. History of PID.  LMP: current, started 07/17/2019.      The history is provided by the patient.    Past Medical History:  Diagnosis Date  . Asthma   . Chronic migraine w/o aura, not intractable, w/o stat migr    SMOKES    There are no active problems to display for this patient.   History reviewed. No pertinent surgical history.  OB History   No obstetric history on file.      Home Medications    Prior to Admission medications   Medication Sig Start Date End Date Taking? Authorizing Provider  bacitracin ointment Apply 1 application topically 2 (two) times daily. Apply to wounds as prescribed to prevent infection. 09/27/18   Antony MaduraHumes, Carmon Brigandi, PA-C  baclofen (LIORESAL) 10 MG tablet Take 10 mg by mouth 3 (three) times daily.    [provider]  doxycycline (VIBRAMYCIN) 100 MG capsule Take 1 capsule (100 mg total) by mouth 2 (two) times daily. 07/18/19   Mickie Bailate, Avereigh Spainhower H, NP  ibuprofen (ADVIL,MOTRIN) 600 MG tablet Take 1 tablet (600 mg total) by mouth every 6 (six) hours as needed for headache, mild pain or moderate pain. 09/27/18   Antony MaduraHumes, Dayona Shaheen, PA-C  metroNIDAZOLE (FLAGYL) 500 MG tablet Take 1 tablet (500 mg total) by mouth 2 (two) times daily for 14 days. 07/18/19 08/01/19  Mickie Bailate, Kasson Lamere H, NP  Pseudoephedrine-Guaifenesin (MUCINEX D MAX STRENGTH) [phone] MG TB12 Take 2 tablets by mouth every 6 (six) hours as needed. Cough    [provider]  topiramate ER (QUDEXY XR) CS24 sprinkle capsule  Take 100 mg by mouth daily.    [provider]    Family History Family History  Problem Relation Age of Onset  . Hypertension Other   . Diabetes Mellitus I Other     Social History Social History   Tobacco Use  . Smoking status: Current Every Day Smoker  . Smokeless tobacco: Never Used  Substance Use Topics  . Alcohol use: Yes  . Drug use: Yes    Types: Marijuana     Allergies   Patient has no known allergies.   Review of Systems Review of Systems  Constitutional: Negative for chills and fever.  HENT: Negative for ear pain and sore throat.   Eyes: Negative for pain and visual disturbance.  Respiratory: Negative for cough and shortness of breath.   Cardiovascular: Negative for chest pain and palpitations.  Gastrointestinal: Negative for abdominal pain, diarrhea and vomiting.  Genitourinary: Positive for pelvic pain and vaginal discharge. Negative for dysuria, flank pain and hematuria.  Musculoskeletal: Negative for arthralgias and back pain.  Skin: Negative for color change and rash.  Neurological: Negative for seizures and syncope.  All other systems reviewed and are negative.    Physical Exam Triage Vital Signs ED Triage Vitals  Enc Vitals Group     BP 07/18/19 1002 116/71     Pulse Rate 07/18/19 1002 78     Resp 07/18/19  1002 16     Temp 07/18/19 1002 98 F (36.7 C)     Temp Source 07/18/19 1002 Oral     SpO2 07/18/19 1002 100 %     Weight --      Height --      Head Circumference --      Peak Flow --      Pain Score 07/18/19 1030 2     Pain Loc --      Pain Edu? --      Excl. in [place]? --    No data found.  Updated Vital Signs BP 116/71 (BP Location: Right Arm)   Pulse 78   Temp 98 F (36.7 C) (Oral)   Resp 16   LMP 07/17/2019 (Exact Date)   SpO2 100%   Visual Acuity Right Eye Distance:   Left Eye Distance:   Bilateral Distance:    Right Eye Near:   Left Eye Near:    Bilateral Near:     Physical Exam Vitals signs and  nursing note reviewed.  Constitutional:      General: She is not in acute distress.    Appearance: She is well-developed.  HENT:     Head: Normocephalic and atraumatic.  Eyes:     Conjunctiva/sclera: Conjunctivae normal.  Neck:     Musculoskeletal: Neck supple.  Cardiovascular:     Rate and Rhythm: Normal rate and regular rhythm.     Heart sounds: No murmur.  Pulmonary:     Effort: Pulmonary effort is normal. No respiratory distress.     Breath sounds: Normal breath sounds.  Abdominal:     Palpations: Abdomen is soft.     Tenderness: There is no abdominal tenderness. There is no right CVA tenderness, left CVA tenderness, guarding or rebound.  Genitourinary:    Labia:        Right: No rash or lesion.        Left: No rash or lesion.      Vagina: Bleeding present.     Cervix: Cervical motion tenderness present.     Uterus: Normal.      Adnexa: Right adnexa normal and left adnexa normal.  Skin:    General: Skin is warm and dry.     Findings: No rash.  Neurological:     Mental Status: She is alert.      UC Treatments / Results  Labs (all labs ordered are listed, but only abnormal results are displayed) Labs Reviewed  POCT URINALYSIS DIP (DEVICE)  CERVICOVAGINAL ANCILLARY ONLY    EKG   Radiology No results found.  Procedures Procedures (including critical care time)  Medications Ordered in UC Medications  cefTRIAXone (ROCEPHIN) injection 250 mg (has no administration in time range)  cefTRIAXone (ROCEPHIN) 250 MG injection (has no administration in time range)  lidocaine (PF) (XYLOCAINE) 1 % injection (has no administration in time range)    Initial Impression / Assessment and Plan / UC Course  I have reviewed the triage vital signs and the nursing notes.  Pertinent labs & imaging results that were available during my care of the patient were reviewed by me and considered in my medical decision making (see chart for details).   Acute pelvic inflammatory  disease.  Potential exposure to STD.  Treated today with Rocephin injection here and prescriptions for metronidazole and doxycycline.  Discussed with patient that she should abstain from sex for 7 to 10 days.  Discussed that if her STD tests come back positive we  will call her if she needs additional treatment and that her sexual partner will need treatment.  Instructed her to return here or go to the emergency department if she develops fever, chills, dysuria, flank pain, back pain, other concerning symptoms.     Final Clinical Impressions(s) / UC Diagnoses   Final diagnoses:  PID (acute pelvic inflammatory disease)  Potential exposure to STD     Discharge Instructions     Your STD tests are pending.  You were treated today with an injection of Rocephin.  You have 2 prescriptions to start taking, metronidazole and doxycycline.   Do not have sex for 7 to 10 days.  If your STDs tests come back positive, we will call you for any needed additional treatment and your sexual partner will need treatment.    Return here or go to the emergency department if you develop fever, chills, difficulty with urination, flank pain, back pain, other concerning symptoms.        ED Prescriptions    Medication Sig Dispense Auth. Provider   metroNIDAZOLE (FLAGYL) 500 MG tablet Take 1 tablet (500 mg total) by mouth 2 (two) times daily for 14 days. 28 tablet Wendee Beaversate, Caiden Monsivais H, NP   doxycycline (VIBRAMYCIN) 100 MG capsule Take 1 capsule (100 mg total) by mouth 2 (two) times daily. 20 capsule Mickie Bailate, Ronna Herskowitz H, NP     Controlled Substance Prescriptions [place] Controlled Substance Registry consulted? Not Applicable   Mickie Bailate, Javonne Dorko H, NP 07/18/19 1124

## 2019-07-20 ENCOUNTER — Telehealth (HOSPITAL_COMMUNITY): Payer: Self-pay | Admitting: Emergency Medicine

## 2019-07-20 LAB — CERVICOVAGINAL ANCILLARY ONLY
Bacterial vaginitis: POSITIVE — AB
Candida vaginitis: POSITIVE — AB
Chlamydia: POSITIVE — AB
Neisseria Gonorrhea: NEGATIVE
Trichomonas: NEGATIVE

## 2019-07-20 MED ORDER — FLUCONAZOLE 150 MG PO TABS: 150.0000 mg | ORAL_TABLET | Freq: Once | ORAL | 0 refills | Status: AC

## 2019-07-20 NOTE — Telephone Encounter (Signed)
Chlamydia is positive.  This was treated at the urgent care visit with po zithromax 1g.  Pt needs education to please refrain from sexual intercourse for 7 days to give the medicine time to work.  Sexual partners need to be notified and tested/treated.  Condoms may reduce risk of reinfection.  Recheck or followup with PCP for further evaluation if symptoms are not improving.  [facility] notified.  Bacterial Vaginosis test is positive.  Prescription for metronidazole was given at the urgent care visit. Pt contacted regarding results. Answered all questions. Verbalized understanding.  Test for candida (yeast) was positive.  Prescription for fluconazole 150mg  po now, repeat dose in 3d if needed, #2 no refills, sent to the pharmacy of record.  Recheck or followup with PCP for further evaluation if symptoms are not improving.    Patient contacted and made aware of all results, all questions answered.

## 2019-09-20 ENCOUNTER — Other Ambulatory Visit: Payer: Self-pay | Admitting: Urgent Care

## 2019-09-20 ENCOUNTER — Ambulatory Visit (HOSPITAL_COMMUNITY)
Admission: EM | Admit: 2019-09-20 | Discharge: 2019-09-20 | Disposition: A | Discharge status: Home / Self Care | Payer: BC Managed Care – PPO | Attending: Family Medicine | Admitting: Family Medicine

## 2019-09-20 ENCOUNTER — Encounter (HOSPITAL_COMMUNITY): Payer: Self-pay | Admitting: Emergency Medicine

## 2019-09-20 DIAGNOSIS — Z8619 Personal history of other infectious and parasitic diseases: Secondary | ICD-10-CM | POA: Insufficient documentation

## 2019-09-20 DIAGNOSIS — Z3202 Encounter for pregnancy test, result negative: Secondary | ICD-10-CM | POA: Diagnosis not present

## 2019-09-20 DIAGNOSIS — Z7251 High risk heterosexual behavior: Secondary | ICD-10-CM | POA: Insufficient documentation

## 2019-09-20 DIAGNOSIS — R102 Pelvic and perineal pain: Secondary | ICD-10-CM | POA: Diagnosis not present

## 2019-09-20 LAB — POCT URINALYSIS DIP (DEVICE)
Bilirubin Urine: NEGATIVE
Glucose, UA: NEGATIVE mg/dL
Hgb urine dipstick: NEGATIVE
Ketones, ur: NEGATIVE mg/dL
Nitrite: NEGATIVE
Protein, ur: NEGATIVE mg/dL
Specific Gravity, Urine: 1.02 (ref 1.005–1.030)
Urobilinogen, UA: 0.2 mg/dL (ref 0.0–1.0)
pH: 6 (ref 5.0–8.0)

## 2019-09-20 LAB — POCT PREGNANCY, URINE: Preg Test, Ur: NEGATIVE

## 2019-09-20 MED ORDER — CEFTRIAXONE SODIUM 250 MG IJ SOLR
INTRAMUSCULAR | Status: AC
  Filled 2019-09-20: qty 250

## 2019-09-20 MED ORDER — METRONIDAZOLE 500 MG PO TABS: 500.0000 mg | ORAL_TABLET | Freq: Two times a day (BID) | ORAL | 0 refills | Status: DC

## 2019-09-20 MED ORDER — AZITHROMYCIN 250 MG PO TABS
ORAL_TABLET | ORAL | Status: AC
  Filled 2019-09-20: qty 4

## 2019-09-20 MED ORDER — FLUCONAZOLE 150 MG PO TABS: 150.0000 mg | ORAL_TABLET | ORAL | 0 refills | Status: DC

## 2019-09-20 MED ORDER — AZITHROMYCIN 250 MG PO TABS
1000.0000 mg | ORAL_TABLET | Freq: Once | ORAL | Status: AC
  Administered 2019-09-20: 1000 mg via ORAL

## 2019-09-20 MED ORDER — KETOROLAC TROMETHAMINE 60 MG/2ML IM SOLN
INTRAMUSCULAR | Status: AC
  Filled 2019-09-20: qty 2

## 2019-09-20 MED ORDER — NAPROXEN 500 MG PO TABS: 500.0000 mg | ORAL_TABLET | Freq: Two times a day (BID) | ORAL | 0 refills | Status: DC

## 2019-09-20 MED ORDER — KETOROLAC TROMETHAMINE 60 MG/2ML IM SOLN
60.0000 mg | Freq: Once | INTRAMUSCULAR | Status: AC
  Administered 2019-09-20: 60 mg via INTRAMUSCULAR

## 2019-09-20 MED ORDER — CEFTRIAXONE SODIUM 250 MG IJ SOLR
250.0000 mg | Freq: Once | INTRAMUSCULAR | Status: AC
  Administered 2019-09-20: 20:00:00 250 mg via INTRAMUSCULAR

## 2019-09-20 NOTE — Discharge Instructions (Signed)
Avoid all forms of sexual intercourse (oral, vaginal, anal) for the next 7 days to avoid spreading/reinfecting. Return if symptoms worsen/do not resolve, you develop fever, abdominal pain, blood in your urine, or are re-exposed to an STI.  

## 2019-09-20 NOTE — ED Triage Notes (Signed)
Low abdominal pain started one week ago.    No burning with urination.  Patient has no vaginal discharge  No diarrhea.  No vomiting

## 2019-09-20 NOTE — ED Provider Notes (Addendum)
MRN: [id number] DOB: [birth year]  Subjective:   Wanda Guzman is a 23 y.o. female presenting for 1 week history of progressively worsening persistent lower abdominal/pelvic pain, currently rated 7 out of 10.  Patient has not taken any medications for relief.  LMP ended on 09/15/2019.  Patient is sexually active, did not use condoms for protection this past week.  Denies fever, nausea, vomiting, vaginal discharge, genital rash, dysuria, hematuria, urinary frequency.  Denies taking any chronic medications.    No Known Allergies  Past Medical History:  Diagnosis Date  . Asthma   . Chronic migraine w/o aura, not intractable, w/o stat migr    SMOKES    History reviewed. No pertinent surgical history.  ROS  Objective:   Vitals: BP 120/60 (BP Location: Right Arm)   Pulse 67   Temp 98.8 F (37.1 C) (Oral)   Resp 18   LMP 09/11/2019   SpO2 100%   Physical Exam Constitutional:      General: She is not in acute distress.    Appearance: Normal appearance. She is well-developed and normal weight. She is not ill-appearing, toxic-appearing or diaphoretic.  HENT:     Head: Normocephalic and atraumatic.     Right Ear: External ear normal.     Left Ear: External ear normal.     Nose: Nose normal.     Mouth/Throat:     Mouth: Mucous membranes are moist.     Pharynx: Oropharynx is clear.  Eyes:     General: No scleral icterus.    Extraocular Movements: Extraocular movements intact.     Pupils: Pupils are equal, round, and reactive to light.  Cardiovascular:     Rate and Rhythm: Normal rate and regular rhythm.     Pulses: Normal pulses.     Heart sounds: Normal heart sounds. No murmur. No friction rub. No gallop.   Pulmonary:     Effort: Pulmonary effort is normal. No respiratory distress.     Breath sounds: Normal breath sounds. No stridor. No wheezing, rhonchi or rales.  Abdominal:     General: Bowel sounds are normal. There is no distension.     Palpations: Abdomen is soft.  There is no mass.     Tenderness: There is abdominal tenderness (lower abdomen/pelvic). There is no right CVA tenderness, left CVA tenderness, guarding or rebound.  Skin:    General: Skin is warm and dry.     Coloration: Skin is not pale.     Findings: No rash.  Neurological:     General: No focal deficit present.     Mental Status: She is alert and oriented to person, place, and time.  Psychiatric:        Mood and Affect: Mood normal.        Behavior: Behavior normal.        Thought Content: Thought content normal.        Judgment: Judgment normal.     Results for orders placed or performed during the hospital encounter of 09/20/19 (from the past 24 hour(s))  POCT urinalysis dip (device)     Status: Abnormal   Collection Time: 09/20/19  7:49 PM  Result Value Ref Range   Glucose, UA NEGATIVE NEGATIVE mg/dL   Bilirubin Urine NEGATIVE NEGATIVE   Ketones, ur NEGATIVE NEGATIVE mg/dL   Specific Gravity, Urine 1.020 1.005 - 1.030   Hgb urine dipstick NEGATIVE NEGATIVE   pH 6.0 5.0 - 8.0   Protein, ur NEGATIVE NEGATIVE mg/dL   Urobilinogen,  UA 0.2 0.0 - 1.0 mg/dL   Nitrite NEGATIVE NEGATIVE   Leukocytes,Ua MODERATE (A) NEGATIVE  Pregnancy, urine POC     Status: None   Collection Time: 09/20/19  8:01 PM  Result Value Ref Range   Preg Test, Ur NEGATIVE NEGATIVE    Assessment and Plan :   1. Pelvic pain in female   2. Unprotected sex     We will treat empirically for gonorrhea and chlamydia with ceftriaxone and azithromycin as recommended by CDC guidelines.  Patient's vital signs are completely stable, there is no guarding on abdominal exam.  Patient did not take anything for pain today, was given IM Toradol in clinic.  Discussed possibility of PID but will hold off on more aggressive management for PID given physical exam, vital signs.  She was recently treated for PID at her last office visit with [place] July 2020.  Testing showed that she had positive chlamydia, BV and yeast  infections.  Will cover for BV and yeast as well.  Labs pending.  Counseled patient on potential for adverse effects with medications prescribed/recommended today, strict ER and strict return-to-clinic precautions discussed, patient verbalized understanding.    Jaynee Eagles, PA-C 09/20/19 2013

## 2019-09-21 LAB — CERVICOVAGINAL ANCILLARY ONLY
Bacterial Vaginitis (gardnerella): POSITIVE — AB
Candida Glabrata: NEGATIVE
Candida Vaginitis: NEGATIVE
Molecular Disclaimer: NEGATIVE
Molecular Disclaimer: NEGATIVE
Molecular Disclaimer: NEGATIVE
Molecular Disclaimer: NORMAL
Trichomonas: NEGATIVE

## 2019-09-21 LAB — URINE CULTURE

## 2019-09-26 LAB — CERVICOVAGINAL ANCILLARY ONLY
Chlamydia: NEGATIVE
Neisseria Gonorrhea: POSITIVE — AB

## 2019-10-30 IMAGING — CT CT CERVICAL SPINE W/O CM
3 of 9 series · 9 of 33 positions shown, 10 images · non-contrast
Comparison: None.

CLINICAL DATA: Trauma, syncope.

EXAM:
CT CERVICAL SPINE WITHOUT CONTRAST
TECHNIQUE: Multidetector CT imaging of the cervical spine was performed without
intravenous contrast. Multiplanar CT image reconstructions were also
generated. Performed in conjunction with CT of the neck, reported
separately.

[Series 6: neck 2.0 st · sagittal · 0.47mm/px · 5 of 101 slices shown (1 of 2)]
[im 15/101  bone]
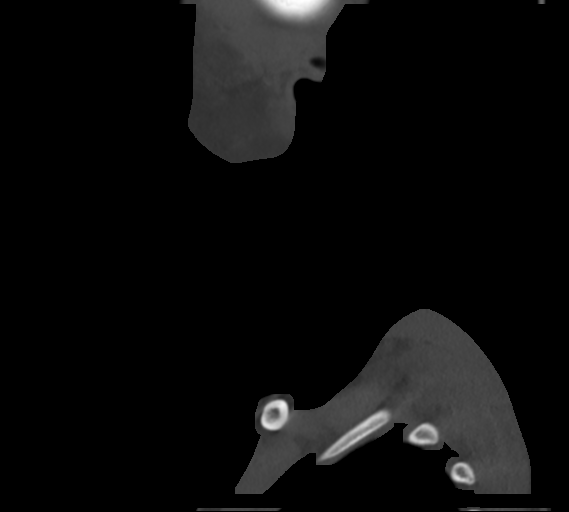
[im 29/101  bone]
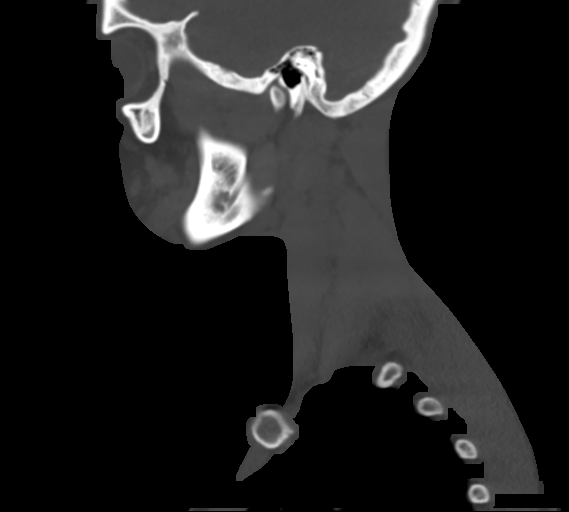
[im 43/101  bone]
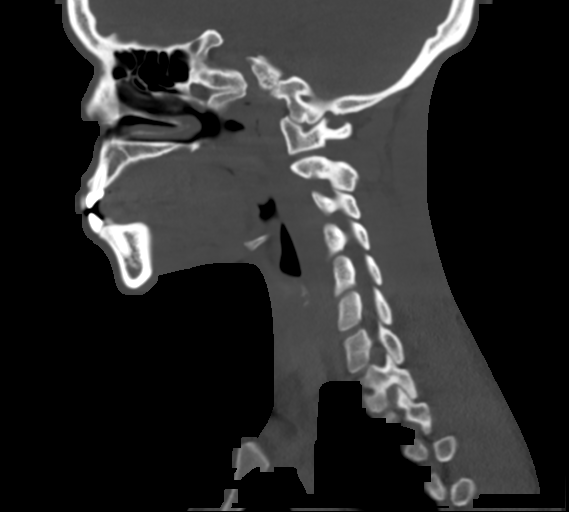
[im 58/101  bone]
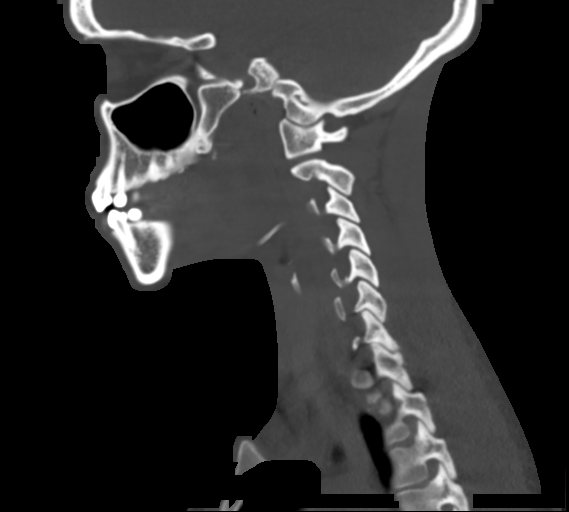
[im 72/101  bone]
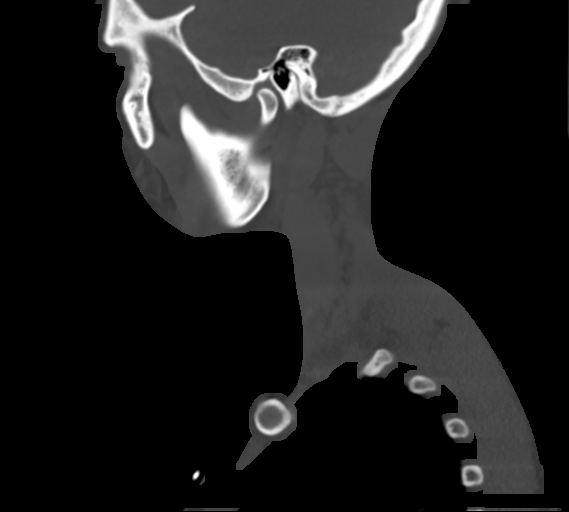

[Series 7: neck 2.0 st · coronal · 0.47mm/px · 2 of 112 slices shown (2 of 2)]
[im 38/112  bone]
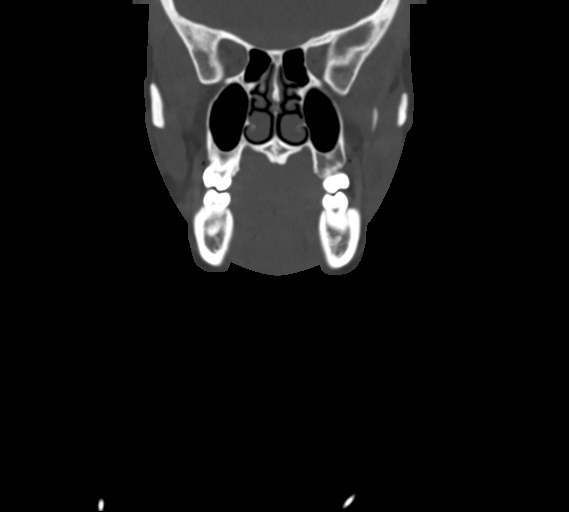
[im 75/112  bone]
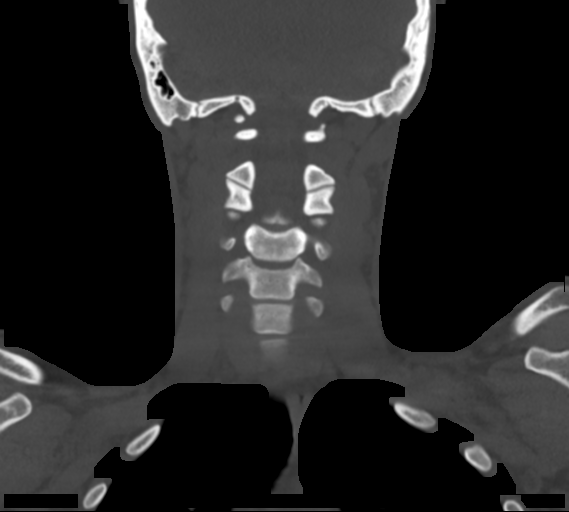

[Series 8: neck 2.0 st orthogonal · axial · 0.39mm/px · z∈[-303,-222]mm · 2 of 130 slices shown, 3 images]
[im 44/130  soft-tissue]
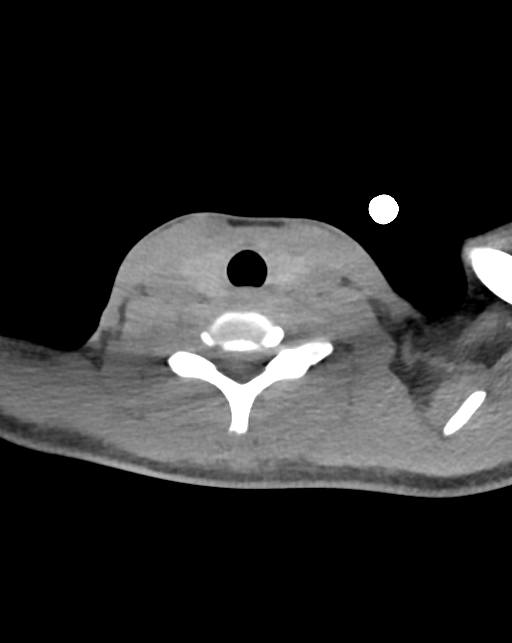
[im 44/130  bone]
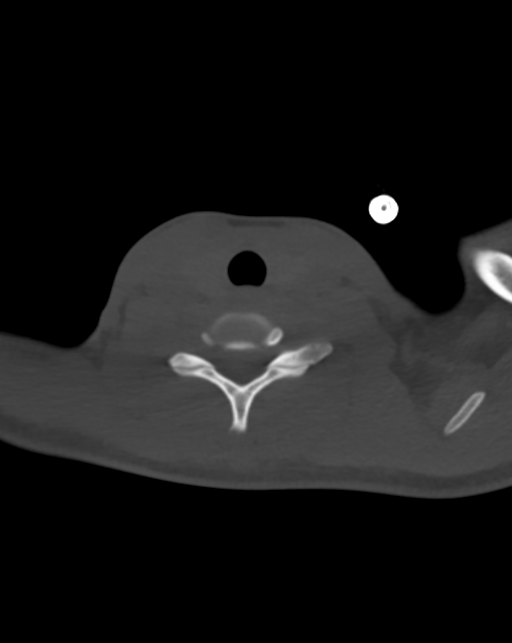
[im 87/130  bone]
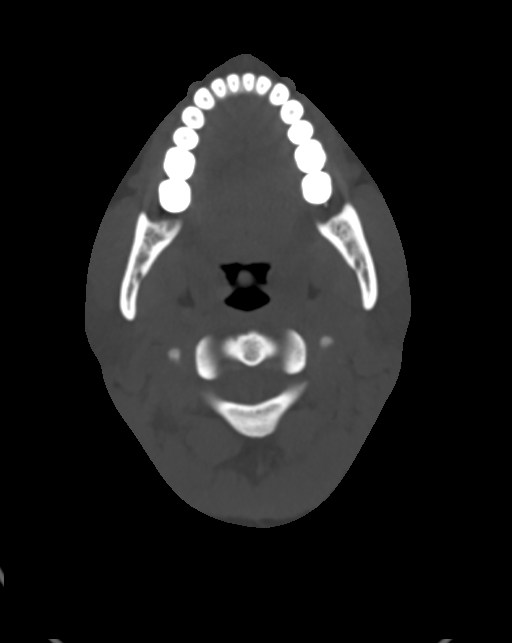

[9 of 33 positions shown; findings below may reference images not displayed]

FINDINGS: Alignment: Normal.

Skull base and vertebrae: No acute fracture. Vertebral body heights
are maintained. The dens and skull base are intact.

Soft tissues and spinal canal: No prevertebral fluid or swelling. No
visible canal hematoma. Soft tissues also assessed on concurrent CT
of the neck.

Disc levels:  Normal.

Upper chest: Negative.

Other: None.
IMPRESSION: No fracture or subluxation of the cervical spine.

## 2019-11-17 ENCOUNTER — Ambulatory Visit (HOSPITAL_COMMUNITY)
Admission: EM | Admit: 2019-11-17 | Discharge: 2019-11-17 | Disposition: A | Discharge status: Home / Self Care | Payer: BC Managed Care – PPO | Attending: Emergency Medicine | Admitting: Emergency Medicine

## 2019-11-17 ENCOUNTER — Encounter (HOSPITAL_COMMUNITY): Payer: Self-pay

## 2019-11-17 ENCOUNTER — Other Ambulatory Visit: Payer: Self-pay

## 2019-11-17 DIAGNOSIS — J029 Acute pharyngitis, unspecified: Secondary | ICD-10-CM

## 2019-11-17 LAB — POCT RAPID STREP A: Streptococcus, Group A Screen (Direct): NEGATIVE

## 2019-11-17 MED ORDER — ACETAMINOPHEN 325 MG PO TABS: 650.0000 mg | ORAL_TABLET | Freq: Four times a day (QID) | ORAL | 0 refills | Status: DC | PRN

## 2019-11-17 MED ORDER — CETIRIZINE-PSEUDOEPHEDRINE ER 5-120 MG PO TB12: 1.0000 | ORAL_TABLET | Freq: Every day | ORAL | 0 refills | Status: DC

## 2019-11-17 NOTE — Discharge Instructions (Addendum)
Strep test negative, will send out for culture and we will call you with results Get plenty of rest and push fluids Zyrtec D prescribed. Use daily for symptomatic relief Gargle with salty warm water Drink warm or cool liquids, use throat lozenges, or popsicles to help alleviate symptoms Take OTC ibuprofen or Tylenol as needed for pain Follow up with PCP if symptoms persists Return or go to ER if patient has any new or worsening symptoms such as fever, chills, nausea, vomiting, worsening sore throat, cough, abdominal pain, chest pain, changes in bowel or bladder habits, etc..Marland Kitchen

## 2019-11-17 NOTE — ED Provider Notes (Signed)
[facility]    CSN: [id number] Arrival date & time: 11/17/19  1836      History   Chief Complaint Chief Complaint  Patient presents with  . Sore Throat    HPI Wanda Guzman is a 23 y.o. female.   The history is provided by the patient. No language interpreter was used.  Sore Throat This is a new problem. The current episode started 2 days ago. The problem occurs constantly. The problem has not changed since onset.Pertinent negatives include no chest pain, no abdominal pain, no headaches and no shortness of breath. Nothing aggravates the symptoms. Nothing relieves the symptoms. She has tried nothing for the symptoms.    Past Medical History:  Diagnosis Date  . Asthma   . Chronic migraine w/o aura, not intractable, w/o stat migr    SMOKES    There are no active problems to display for this patient.   History reviewed. No pertinent surgical history.  OB History   No obstetric history on file.      Home Medications    Prior to Admission medications   Medication Sig Start Date End Date Taking? Authorizing Provider  acetaminophen (TYLENOL) 325 MG tablet Take 2 tablets (650 mg total) by mouth every 6 (six) hours as needed for moderate pain. 11/17/19   Americus Scheurich, Darrelyn Hillock, FNP  cetirizine-pseudoephedrine (ZYRTEC-D) 5-120 MG tablet Take 1 tablet by mouth daily. 11/17/19   Briggett Tuccillo, Darrelyn Hillock, FNP  fluconazole (DIFLUCAN) 150 MG tablet Take 1 tablet (150 mg total) by mouth once a week. 09/20/19   Jaynee Eagles, PA-C  ibuprofen (ADVIL,MOTRIN) 600 MG tablet Take 1 tablet (600 mg total) by mouth every 6 (six) hours as needed for headache, mild pain or moderate pain. 09/27/18   Antonietta Breach, PA-C  metroNIDAZOLE (FLAGYL) 500 MG tablet Take 1 tablet (500 mg total) by mouth 2 (two) times daily with a meal. DO NOT CONSUME ALCOHOL WHILE TAKING THIS MEDICATION. 09/20/19   Jaynee Eagles, PA-C  naproxen (NAPROSYN) 500 MG tablet Take 1 tablet (500 mg total) by mouth 2 (two) times  daily. 09/20/19   Jaynee Eagles, PA-C  topiramate ER (QUDEXY XR) CS24 sprinkle capsule Take 100 mg by mouth daily.  09/20/19  [provider]    Family History Family History  Problem Relation Age of Onset  . Healthy Mother   . Hypertension Other   . Diabetes Mellitus I Other     Social History Social History   Tobacco Use  . Smoking status: Current Every Day Smoker  . Smokeless tobacco: Never Used  . Tobacco comment: black and mild  Substance Use Topics  . Alcohol use: Yes  . Drug use: Yes    Types: Marijuana     Allergies   Patient has no known allergies.   Review of Systems Review of Systems  Constitutional: Negative for activity change, chills, diaphoresis, fatigue and fever.  HENT: Positive for sore throat. Negative for sinus pressure and sinus pain.   Respiratory: Negative for cough, chest tightness and shortness of breath.   Cardiovascular: Negative for chest pain and leg swelling.  Gastrointestinal: Negative for abdominal distention and abdominal pain.  Neurological: Negative for headaches.  ROS: all other are negatives   Physical Exam Triage Vital Signs ED Triage Vitals  Enc Vitals Group     BP 11/17/19 2007 120/66     Pulse Rate 11/17/19 2007 83     Resp 11/17/19 2007 16     Temp 11/17/19 2007 98.6  F (37 C)     Temp Source 11/17/19 2007 Oral     SpO2 11/17/19 2007 100 %     Weight --      Height --      Head Circumference --      Peak Flow --      Pain Score 11/17/19 2009 0     Pain Loc --      Pain Edu? --      Excl. in GC? --    No data found.  Updated Vital Signs BP 120/66 (BP Location: Left Arm)   Pulse 83   Temp 98.6 F (37 C) (Oral)   Resp 16   LMP 11/02/2019   SpO2 100%   Visual Acuity Right Eye Distance:   Left Eye Distance:   Bilateral Distance:    Right Eye Near:   Left Eye Near:    Bilateral Near:     Physical Exam Constitutional:      General: She is not in acute distress.    Appearance: She is  well-developed and normal weight. She is not ill-appearing or toxic-appearing.  HENT:     Right Ear: Tympanic membrane, ear canal and external ear normal. There is no impacted cerumen.     Left Ear: Tympanic membrane, ear canal and external ear normal. There is no impacted cerumen.     Nose: No congestion.     Mouth/Throat:     Mouth: Mucous membranes are moist.     Pharynx: No oropharyngeal exudate.     Tonsils: No tonsillar exudate or tonsillar abscesses. 2+ on the right. 2+ on the left.  Cardiovascular:     Rate and Rhythm: Normal rate and regular rhythm.     Pulses: Normal pulses.     Heart sounds: Normal heart sounds. No murmur.  Pulmonary:     Effort: Pulmonary effort is normal. No respiratory distress.     Breath sounds: Normal breath sounds. No rhonchi.  Chest:     Chest wall: No tenderness.  Abdominal:     General: Abdomen is flat. Bowel sounds are normal.     Palpations: Abdomen is soft.  Skin:    General: Skin is warm.  Neurological:     Mental Status: She is alert and oriented to person, place, and time.      UC Treatments / Results  Labs (all labs ordered are listed, but only abnormal results are displayed) Labs Reviewed  CULTURE, GROUP A STREP [facility]  POCT RAPID STREP A    EKG   Radiology No results found.  Procedures Procedures (including critical care time)  Medications Ordered in UC Medications - No data to display  Initial Impression / Assessment and Plan / UC Course  I have reviewed the triage vital signs and the nursing notes.  Pertinent labs & imaging results that were available during my care of the patient were reviewed by me and considered in my medical decision making (see chart for details).   Patient stable, refused to be checked for COVID -19 at this time. Strep test was ordered and patient will be called if positive Advise patient to follow with her PCP Final Clinical Impressions(s) / UC Diagnoses   Final diagnoses:  Viral  pharyngitis     Discharge Instructions     Strep test negative, will send out for culture and we will call you with results Get plenty of rest and push fluids Zyrtec D prescribed. Use daily for symptomatic relief Gargle with salty  warm water Drink warm or cool liquids, use throat lozenges, or popsicles to help alleviate symptoms Take OTC ibuprofen or Tylenol as needed for pain Follow up with PCP if symptoms persists Return or go to ER if patient has any new or worsening symptoms such as fever, chills, nausea, vomiting, worsening sore throat, cough, abdominal pain, chest pain, changes in bowel or bladder habits, etc...    ED Prescriptions    Medication Sig Dispense Auth. Provider   cetirizine-pseudoephedrine (ZYRTEC-D) 5-120 MG tablet Take 1 tablet by mouth daily. 30 tablet Gusta Marksberry, Zachery Dakins, FNP   acetaminophen (TYLENOL) 325 MG tablet Take 2 tablets (650 mg total) by mouth every 6 (six) hours as needed for moderate pain. 30 tablet Lenzy Kerschner, Zachery Dakins, FNP     PDMP not reviewed this encounter.   Durward Parcel, FNP 11/17/19 2038

## 2019-11-17 NOTE — ED Triage Notes (Signed)
Pt presents to UC w/ sore throat, difficulty swallowing x2 days. Pt states sore throat comes and goes and may change from different side of throat.

## 2019-11-20 LAB — CULTURE, GROUP A STREP (THRC)

## 2019-12-15 ENCOUNTER — Encounter (HOSPITAL_COMMUNITY): Payer: Self-pay | Admitting: Emergency Medicine

## 2019-12-15 ENCOUNTER — Other Ambulatory Visit: Payer: Self-pay

## 2019-12-15 ENCOUNTER — Ambulatory Visit (HOSPITAL_COMMUNITY)
Admission: EM | Admit: 2019-12-15 | Discharge: 2019-12-15 | Disposition: A | Discharge status: Home / Self Care | Payer: BC Managed Care – PPO | Attending: Urgent Care | Admitting: Urgent Care

## 2019-12-15 DIAGNOSIS — R102 Pelvic and perineal pain: Secondary | ICD-10-CM

## 2019-12-15 DIAGNOSIS — Z113 Encounter for screening for infections with a predominantly sexual mode of transmission: Secondary | ICD-10-CM | POA: Diagnosis not present

## 2019-12-15 DIAGNOSIS — Z7251 High risk heterosexual behavior: Secondary | ICD-10-CM | POA: Diagnosis not present

## 2019-12-15 DIAGNOSIS — Z3202 Encounter for pregnancy test, result negative: Secondary | ICD-10-CM | POA: Diagnosis not present

## 2019-12-15 LAB — POCT URINALYSIS DIP (DEVICE)
Bilirubin Urine: NEGATIVE
Glucose, UA: NEGATIVE mg/dL
Hgb urine dipstick: NEGATIVE
Ketones, ur: NEGATIVE mg/dL
Leukocytes,Ua: NEGATIVE
Nitrite: NEGATIVE
Protein, ur: NEGATIVE mg/dL
Specific Gravity, Urine: 1.025 (ref 1.005–1.030)
Urobilinogen, UA: 0.2 mg/dL (ref 0.0–1.0)
pH: 6.5 (ref 5.0–8.0)

## 2019-12-15 LAB — POCT PREGNANCY, URINE: Preg Test, Ur: NEGATIVE

## 2019-12-15 LAB — POC URINE PREG, ED: Preg Test, Ur: NEGATIVE

## 2019-12-15 MED ORDER — CEFTRIAXONE SODIUM 250 MG IJ SOLR
INTRAMUSCULAR | Status: AC
  Filled 2019-12-15: qty 250

## 2019-12-15 MED ORDER — NAPROXEN 500 MG PO TABS: 500.0000 mg | ORAL_TABLET | Freq: Two times a day (BID) | ORAL | 0 refills | Status: DC

## 2019-12-15 MED ORDER — AZITHROMYCIN 250 MG PO TABS
1000.0000 mg | ORAL_TABLET | Freq: Once | ORAL | Status: AC
  Administered 2019-12-15: 17:00:00 1000 mg via ORAL

## 2019-12-15 MED ORDER — AZITHROMYCIN 250 MG PO TABS
ORAL_TABLET | ORAL | Status: AC
  Filled 2019-12-15: qty 4

## 2019-12-15 MED ORDER — CEFTRIAXONE SODIUM 250 MG IJ SOLR
250.0000 mg | Freq: Once | INTRAMUSCULAR | Status: AC
  Administered 2019-12-15: 17:00:00 250 mg via INTRAMUSCULAR

## 2019-12-15 NOTE — ED Provider Notes (Signed)
[facility]   MRN: [id number] DOB: 09-Apr-1996  Subjective:   Wanda Guzman is a 22 y.o. female presenting for 2 to 3-day history of mild to moderate intermittent persistent pelvic pains.  Patient is concerned about STIs and would like to get treated.  She has multiple partners and does not always use condoms for protection.  She does not have a gynecologist, last visit was when she was 20 and has not had a recheck since then.  No current facility-administered medications for this encounter.  Current Outpatient Medications:  .  acetaminophen (TYLENOL) 325 MG tablet, Take 2 tablets (650 mg total) by mouth every 6 (six) hours as needed for moderate pain., Disp: 30 tablet, Rfl: 0 .  cetirizine-pseudoephedrine (ZYRTEC-D) 5-120 MG tablet, Take 1 tablet by mouth daily., Disp: 30 tablet, Rfl: 0 .  ibuprofen (ADVIL,MOTRIN) 600 MG tablet, Take 1 tablet (600 mg total) by mouth every 6 (six) hours as needed for headache, mild pain or moderate pain., Disp: 30 tablet, Rfl: 0 .  naproxen (NAPROSYN) 500 MG tablet, Take 1 tablet (500 mg total) by mouth 2 (two) times daily., Disp: 30 tablet, Rfl: 0   No Known Allergies  Past Medical History:  Diagnosis Date  . Asthma   . Chronic migraine w/o aura, not intractable, w/o stat migr    SMOKES     History reviewed. No pertinent surgical history.  Family History  Problem Relation Age of Onset  . Healthy Mother   . Hypertension Other   . Diabetes Mellitus I Other     Social History   Tobacco Use  . Smoking status: Current Every Day Smoker  . Smokeless tobacco: Never Used  . Tobacco comment: black and mild  Substance Use Topics  . Alcohol use: Yes  . Drug use: Yes    Types: Marijuana    ROS Denies fever, nausea, vomiting, dysuria, hematuria, vaginal discharge, genital rash.  Objective:   Vitals: BP 118/66 (BP Location: Right Arm)   Pulse 70   Temp 98.4 F (36.9 C) (Oral)   Resp 18   LMP 11/29/2019 (Exact Date)   SpO2 100%    Physical Exam Constitutional:      General: She is not in acute distress.    Appearance: Normal appearance. She is well-developed and normal weight. She is not ill-appearing, toxic-appearing or diaphoretic.  HENT:     Head: Normocephalic and atraumatic.     Right Ear: External ear normal.     Left Ear: External ear normal.     Nose: Nose normal.     Mouth/Throat:     Mouth: Mucous membranes are moist.     Pharynx: Oropharynx is clear.  Eyes:     General: No scleral icterus.    Extraocular Movements: Extraocular movements intact.     Pupils: Pupils are equal, round, and reactive to light.  Cardiovascular:     Rate and Rhythm: Normal rate and regular rhythm.     Pulses: Normal pulses.     Heart sounds: Normal heart sounds. No murmur. No friction rub. No gallop.   Pulmonary:     Effort: Pulmonary effort is normal. No respiratory distress.     Breath sounds: Normal breath sounds. No stridor. No wheezing, rhonchi or rales.  Abdominal:     General: Bowel sounds are normal. There is no distension.     Palpations: Abdomen is soft. There is no mass.     Tenderness: There is abdominal tenderness in the suprapubic area. There  is no right CVA tenderness, left CVA tenderness, guarding or rebound.  Skin:    General: Skin is warm and dry.     Coloration: Skin is not pale.     Findings: No rash.  Neurological:     General: No focal deficit present.     Mental Status: She is alert and oriented to person, place, and time.  Psychiatric:        Mood and Affect: Mood normal.        Behavior: Behavior normal.        Thought Content: Thought content normal.        Judgment: Judgment normal.     Results for orders placed or performed during the hospital encounter of 12/15/19 (from the past 24 hour(s))  POCT urinalysis dip (device)     Status: None   Collection Time: 12/15/19  4:45 PM  Result Value Ref Range   Glucose, UA NEGATIVE NEGATIVE mg/dL   Bilirubin Urine NEGATIVE NEGATIVE    Ketones, ur NEGATIVE NEGATIVE mg/dL   Specific Gravity, Urine 1.025 1.005 - 1.030   Hgb urine dipstick NEGATIVE NEGATIVE   pH 6.5 5.0 - 8.0   Protein, ur NEGATIVE NEGATIVE mg/dL   Urobilinogen, UA 0.2 0.0 - 1.0 mg/dL   Nitrite NEGATIVE NEGATIVE   Leukocytes,Ua NEGATIVE NEGATIVE  POC urine pregnancy     Status: None   Collection Time: 12/15/19  4:55 PM  Result Value Ref Range   Preg Test, Ur NEGATIVE NEGATIVE  Pregnancy, urine POC     Status: None   Collection Time: 12/15/19  4:55 PM  Result Value Ref Range   Preg Test, Ur NEGATIVE NEGATIVE    Assessment and Plan :   1. High risk heterosexual behavior   2. Unprotected sex   3. Pelvic pain in female     We will treat empirically per CDC guidelines for gonorrhea and chlamydia with IM ceftriaxone and azithromycin in clinic.  Counseled on safe sex practices including abstaining for 1 week following treatment.  Discussed possibility of patient needing evaluation for fibroids, ovarian cyst, endometriosis or other possibilities through ultrasound.  Emphasized need to establish care with gynecologist.  We will have her use naproxen for supportive care for pelvic pains.  Counseled patient on potential for adverse effects with medications prescribed/recommended today, ER and return-to-clinic precautions discussed, patient verbalized understanding.    Jaynee Eagles, PA-C 12/15/19 1714

## 2019-12-15 NOTE — ED Triage Notes (Signed)
Pt reports some lower abdominal pain that started two days ago.  She denies any other urinary symptoms.  Pt would like to be tested for STD's.

## 2019-12-15 NOTE — Discharge Instructions (Signed)
Avoid all forms of sexual intercourse (oral, vaginal, anal) for the next 7 days to avoid spreading/reinfecting. Return if symptoms worsen/do not resolve, you develop fever, abdominal pain, blood in your urine, or are re-exposed to an STI.  

## 2019-12-19 ENCOUNTER — Telehealth: Payer: Self-pay | Admitting: Emergency Medicine

## 2019-12-19 LAB — CERVICOVAGINAL ANCILLARY ONLY
Bacterial vaginitis: POSITIVE — AB
Candida vaginitis: NEGATIVE
Chlamydia: NEGATIVE
Neisseria Gonorrhea: NEGATIVE
Trichomonas: NEGATIVE

## 2019-12-19 MED ORDER — METRONIDAZOLE 500 MG PO TABS: 500.0000 mg | ORAL_TABLET | Freq: Two times a day (BID) | ORAL | 0 refills | Status: AC

## 2019-12-19 NOTE — Telephone Encounter (Signed)
Bacterial vaginosis is positive. This was not treated at the urgent care visit.  Flagyl 500 mg BID x 7 days #14 no refills sent to patients pharmacy of choice.    Patient contacted by phone and made aware of    results. Pt verbalized understanding and had all questions answered.    

## 2020-04-04 ENCOUNTER — Ambulatory Visit
Admission: EM | Admit: 2020-04-04 | Discharge: 2020-04-04 | Disposition: A | Discharge status: Home / Self Care | Payer: BC Managed Care – PPO | Attending: Family Medicine | Admitting: Family Medicine

## 2020-04-04 ENCOUNTER — Other Ambulatory Visit: Payer: Self-pay

## 2020-04-04 ENCOUNTER — Encounter: Payer: Self-pay | Admitting: Emergency Medicine

## 2020-04-04 DIAGNOSIS — R102 Pelvic and perineal pain: Secondary | ICD-10-CM | POA: Insufficient documentation

## 2020-04-04 LAB — URINALYSIS, COMPLETE (UACMP) WITH MICROSCOPIC
Glucose, UA: NEGATIVE mg/dL
Nitrite: NEGATIVE
Protein, ur: 30 mg/dL — AB
Specific Gravity, Urine: 1.025 (ref 1.005–1.030)
pH: 6.5 (ref 5.0–8.0)

## 2020-04-04 LAB — WET PREP, GENITAL
Clue Cells Wet Prep HPF POC: NONE SEEN
Sperm: NONE SEEN
Trich, Wet Prep: NONE SEEN
WBC, Wet Prep HPF POC: NONE SEEN
Yeast Wet Prep HPF POC: NONE SEEN

## 2020-04-04 LAB — CHLAMYDIA/NGC RT PCR (ARMC ONLY)
Chlamydia Tr: DETECTED — AB
N gonorrhoeae: DETECTED — AB

## 2020-04-04 MED ORDER — KETOROLAC TROMETHAMINE 10 MG PO TABS: 10.0000 mg | ORAL_TABLET | Freq: Four times a day (QID) | ORAL | 0 refills | Status: DC | PRN

## 2020-04-04 NOTE — ED Triage Notes (Signed)
Patient in today c/o lower abdominal pain x 3 days, getting worse. Patient states the pain is worse when she walks or coughs. Patient states last time she felt like this she had BV and would like to get tested for that and GC/Chlam.

## 2020-04-04 NOTE — ED Provider Notes (Addendum)
[facility]    CSN: [id number] Arrival date & time: 04/04/20  1030      History   Chief Complaint Chief Complaint  Patient presents with  . Abdominal Pain    HPI  24 year old female presents with lower abdominal pain/pelvic pain.  Patient has had multiple visits to the ER and urgent care in [facility] for similar complaints.  Has had previous chlamydia and gonorrhea.  Patient states that she has had low abdominal pain/pelvic pain for the past 3 days.  She is sexually active.  Is having unprotected intercourse.  Patient denies vaginal discharge.  She is concerned about the possibility of STD.  Rates her pain as 5/10 in severity.  She is currently on her menstrual cycle.  She describes her pain as dull/cramping.  No relieving factors.  Patient states that she has had this previously and is concerned that she may have bacterial vaginosis.  Additionally, patient states that she has some discomfort when she urinates.  She states that it is not a burning sensation.  No other urinary symptoms.  No other complaints at this time.  Past Medical History:  Diagnosis Date  . Asthma   . Chronic migraine w/o aura, not intractable, w/o stat migr    SMOKES   Past Surgical History:  Procedure Laterality Date  . NO PAST SURGERIES      OB History   No obstetric history on file.    Home Medications    Prior to Admission medications   Medication Sig Start Date End Date Taking? Authorizing Provider  ketorolac (TORADOL) 10 MG tablet Take 1 tablet (10 mg total) by mouth every 6 (six) hours as needed for moderate pain or severe pain. 04/04/20   Coral Spikes, DO  topiramate ER (QUDEXY XR) CS24 sprinkle capsule Take 100 mg by mouth daily.  09/20/19  [provider]    Family History Family History  Problem Relation Age of Onset  . Healthy Mother   . Hypertension Father   . Hypertension Other   . Diabetes Mellitus I Other     Social History Social History   Tobacco Use    . Smoking status: Current Every Day Smoker  . Smokeless tobacco: Never Used  . Tobacco comment: black and mild  Substance Use Topics  . Alcohol use: Yes    Comment: occasionally  . Drug use: Yes    Frequency: 14.0 times per week    Types: Marijuana    Comment: smokes every day, 1-2 times per day     Allergies   Patient has no known allergies.   Review of Systems Review of Systems  Constitutional: Negative for fever.  Genitourinary: Positive for dysuria and pelvic pain.   Physical Exam Triage Vital Signs ED Triage Vitals  Enc Vitals Group     BP 04/04/20 1055 117/77     Pulse Rate 04/04/20 1055 68     Resp 04/04/20 1055 18     Temp 04/04/20 1055 98.5 F (36.9 C)     Temp Source 04/04/20 1055 Oral     SpO2 04/04/20 1055 100 %     Weight 04/04/20 1057 148 lb 12.8 oz (67.5 kg)     Height 04/04/20 1057 5\' 2"  (1.575 m)     Head Circumference --      Peak Flow --      Pain Score 04/04/20 1056 5     Pain Loc --      Pain Edu? --  Excl. in GC? --    Updated Vital Signs BP 117/77 (BP Location: Left Arm)   Pulse 68   Temp 98.5 F (36.9 C) (Oral)   Resp 18   Ht 5\' 2"  (1.575 m)   Wt 67.5 kg   LMP 04/03/2020 (Exact Date)   SpO2 100%   BMI 27.22 kg/m   Visual Acuity Right Eye Distance:   Left Eye Distance:   Bilateral Distance:    Right Eye Near:   Left Eye Near:    Bilateral Near:     Physical Exam Vitals and nursing note reviewed.  Constitutional:      General: She is not in acute distress.    Appearance: She is well-developed. She is not ill-appearing.  HENT:     Head: Normocephalic and atraumatic.  Eyes:     General:        Right eye: No discharge.        Left eye: No discharge.     Conjunctiva/sclera: Conjunctivae normal.  Cardiovascular:     Rate and Rhythm: Normal rate and regular rhythm.     Heart sounds: No murmur.  Pulmonary:     Effort: Pulmonary effort is normal.     Breath sounds: Normal breath sounds. No wheezing, rhonchi or rales.   Abdominal:     General: There is no distension.     Palpations: Abdomen is soft.     Comments: Tenderness to palpation throughout the lower abdomen.  Neurological:     Mental Status: She is alert.    UC Treatments / Results  Labs (all labs ordered are listed, but only abnormal results are displayed) Labs Reviewed  URINALYSIS, COMPLETE (UACMP) WITH MICROSCOPIC - Abnormal; Notable for the following components:      Result Value   Color, Urine AMBER (*)    APPearance HAZY (*)    Hgb urine dipstick SMALL (*)    Bilirubin Urine SMALL (*)    Ketones, ur TRACE (*)    Protein, ur 30 (*)    Leukocytes,Ua SMALL (*)    Bacteria, UA FEW (*)    All other components within normal limits  WET PREP, GENITAL  CHLAMYDIA/NGC RT PCR ([facility] ONLY)  URINE CULTURE    EKG   Radiology No results found.  Procedures Procedures (including critical care time)  Medications Ordered in UC Medications - No data to display  Initial Impression / Assessment and Plan / UC Course  I have reviewed the triage vital signs and the nursing notes.  Pertinent labs & imaging results that were available during my care of the patient were reviewed by me and considered in my medical decision making (see chart for details).    24 year old female presents with pelvic pain.  Wet prep negative.  Urinalysis with some pyuria but 6-10 squamous epithelial cells.  Awaiting culture.  Awaiting STD results.  Toradol as needed for pain.  Supportive care.  Final Clinical Impressions(s) / UC Diagnoses   Final diagnoses:  Pelvic pain in female     Discharge Instructions     Awaiting STD test results.  No BV or yeast seen.  Medication as needed for pain.  Take care  Dr. 25     ED Prescriptions    Medication Sig Dispense Auth. Provider   ketorolac (TORADOL) 10 MG tablet Take 1 tablet (10 mg total) by mouth every 6 (six) hours as needed for moderate pain or severe pain. 20 tablet [place], [place], [place]  PDMP not reviewed this encounter.   Tommie Sams, DO 04/04/20 1139    Everlene Other [place], [place] 04/09/20 [phone]

## 2020-04-04 NOTE — Discharge Instructions (Signed)
Awaiting STD test results.  No BV or yeast seen.  Medication as needed for pain.  Take care  Dr. Adriana Simas

## 2020-04-05 ENCOUNTER — Emergency Department (HOSPITAL_BASED_OUTPATIENT_CLINIC_OR_DEPARTMENT_OTHER)
Admission: EM | Admit: 2020-04-05 | Discharge: 2020-04-05 | Disposition: A | Discharge status: Home / Self Care | Payer: Self-pay | Attending: Emergency Medicine | Admitting: Emergency Medicine

## 2020-04-05 ENCOUNTER — Emergency Department (HOSPITAL_BASED_OUTPATIENT_CLINIC_OR_DEPARTMENT_OTHER): Payer: Self-pay

## 2020-04-05 ENCOUNTER — Other Ambulatory Visit: Payer: Self-pay

## 2020-04-05 ENCOUNTER — Encounter (HOSPITAL_BASED_OUTPATIENT_CLINIC_OR_DEPARTMENT_OTHER): Payer: Self-pay | Admitting: Emergency Medicine

## 2020-04-05 DIAGNOSIS — J45909 Unspecified asthma, uncomplicated: Secondary | ICD-10-CM | POA: Insufficient documentation

## 2020-04-05 DIAGNOSIS — F17200 Nicotine dependence, unspecified, uncomplicated: Secondary | ICD-10-CM | POA: Insufficient documentation

## 2020-04-05 DIAGNOSIS — N739 Female pelvic inflammatory disease, unspecified: Secondary | ICD-10-CM | POA: Insufficient documentation

## 2020-04-05 DIAGNOSIS — N730 Acute parametritis and pelvic cellulitis: Secondary | ICD-10-CM

## 2020-04-05 DIAGNOSIS — A749 Chlamydial infection, unspecified: Secondary | ICD-10-CM | POA: Insufficient documentation

## 2020-04-05 DIAGNOSIS — R1011 Right upper quadrant pain: Secondary | ICD-10-CM

## 2020-04-05 DIAGNOSIS — A549 Gonococcal infection, unspecified: Secondary | ICD-10-CM | POA: Insufficient documentation

## 2020-04-05 LAB — COMPREHENSIVE METABOLIC PANEL
ALT: 12 U/L (ref 0–44)
AST: 22 U/L (ref 15–41)
Albumin: 3.9 g/dL (ref 3.5–5.0)
Alkaline Phosphatase: 49 U/L (ref 38–126)
Anion gap: 11 (ref 5–15)
BUN: 5 mg/dL — ABNORMAL LOW (ref 6–20)
CO2: 24 mmol/L (ref 22–32)
Calcium: 9 mg/dL (ref 8.9–10.3)
Chloride: 101 mmol/L (ref 98–111)
Creatinine, Ser: 0.79 mg/dL (ref 0.44–1.00)
GFR calc Af Amer: >60 mL/min (ref >60)
GFR calc non Af Amer: >60 mL/min (ref >60)
Glucose, Bld: 108 mg/dL — ABNORMAL HIGH (ref 70–99)
Potassium: 3.9 mmol/L (ref 3.5–5.1)
Sodium: 136 mmol/L (ref 135–145)
Total Bilirubin: 0.6 mg/dL (ref 0.3–1.2)
Total Protein: 7.4 g/dL (ref 6.5–8.1)

## 2020-04-05 LAB — URINALYSIS, ROUTINE W REFLEX MICROSCOPIC
Bilirubin Urine: NEGATIVE
Glucose, UA: NEGATIVE mg/dL
Hgb urine dipstick: NEGATIVE
Ketones, ur: 15 mg/dL — AB
Nitrite: POSITIVE — AB
Protein, ur: NEGATIVE mg/dL
Specific Gravity, Urine: 1.015 (ref 1.005–1.030)
pH: 7 (ref 5.0–8.0)

## 2020-04-05 LAB — CBC WITH DIFFERENTIAL/PLATELET
Abs Immature Granulocytes: 0.05 10*3/uL (ref 0.00–0.07)
Basophils Absolute: 0 10*3/uL (ref 0.0–0.1)
Basophils Relative: 0 %
Eosinophils Absolute: 0 10*3/uL (ref 0.0–0.5)
Eosinophils Relative: 0 %
HCT: 40.3 % (ref 36.0–46.0)
Hemoglobin: 13.2 g/dL (ref 12.0–15.0)
Immature Granulocytes: 0 %
Lymphocytes Relative: 16 %
Lymphs Abs: 1.8 10*3/uL (ref 0.7–4.0)
MCH: 28.8 pg (ref 26.0–34.0)
MCHC: 32.8 g/dL (ref 30.0–36.0)
MCV: 87.8 fL (ref 80.0–100.0)
Monocytes Absolute: 0.7 10*3/uL (ref 0.1–1.0)
Monocytes Relative: 7 %
Neutro Abs: 8.7 10*3/uL — ABNORMAL HIGH (ref 1.7–7.7)
Neutrophils Relative %: 77 %
Platelets: 217 10*3/uL (ref 150–400)
RBC: 4.59 MIL/uL (ref 3.87–5.11)
RDW: 13.3 % (ref 11.5–15.5)
WBC: 11.3 10*3/uL — ABNORMAL HIGH (ref 4.0–10.5)
nRBC: 0 % (ref 0.0–0.2)

## 2020-04-05 LAB — LIPASE, BLOOD: Lipase: 21 U/L (ref 11–51)

## 2020-04-05 LAB — URINALYSIS, MICROSCOPIC (REFLEX): RBC / HPF: NONE SEEN RBC/hpf (ref 0–5)

## 2020-04-05 LAB — HIV ANTIBODY (ROUTINE TESTING W REFLEX): HIV Screen 4th Generation wRfx: NONREACTIVE

## 2020-04-05 LAB — HCG, QUANTITATIVE, PREGNANCY: hCG, Beta Chain, Quant, S: 3 m[IU]/mL (ref <5)

## 2020-04-05 LAB — RPR: RPR Ser Ql: NONREACTIVE

## 2020-04-05 MED ORDER — SODIUM CHLORIDE 0.9 % IV SOLN
1.0000 g | Freq: Once | INTRAVENOUS | Status: AC
  Administered 2020-04-05: 07:00:00 1 g via INTRAVENOUS
  Filled 2020-04-05: qty 10

## 2020-04-05 MED ORDER — DOXYCYCLINE HYCLATE 100 MG PO CAPS: 100.0000 mg | ORAL_CAPSULE | Freq: Two times a day (BID) | ORAL | 0 refills | Status: DC

## 2020-04-05 MED ORDER — HYDROCODONE-ACETAMINOPHEN 5-325 MG PO TABS: 1.0000 | ORAL_TABLET | ORAL | 0 refills | Status: DC | PRN

## 2020-04-05 MED ORDER — FENTANYL CITRATE (PF) 100 MCG/2ML IJ SOLN
50.0000 ug | Freq: Once | INTRAMUSCULAR | Status: AC
  Administered 2020-04-05: 50 ug via INTRAVENOUS
  Filled 2020-04-05: qty 2

## 2020-04-05 MED ORDER — SODIUM CHLORIDE 0.9 % IV SOLN
100.0000 mg | Freq: Once | INTRAVENOUS | Status: AC
  Administered 2020-04-05: 100 mg via INTRAVENOUS
  Filled 2020-04-05 (×2): qty 100

## 2020-04-05 MED ORDER — ONDANSETRON HCL 4 MG/2ML IJ SOLN
4.0000 mg | Freq: Once | INTRAMUSCULAR | Status: AC
  Administered 2020-04-05: 4 mg via INTRAVENOUS
  Filled 2020-04-05: qty 2

## 2020-04-05 MED ORDER — HYDROMORPHONE HCL 1 MG/ML IJ SOLN
1.0000 mg | Freq: Once | INTRAMUSCULAR | Status: AC
  Administered 2020-04-05: 1 mg via INTRAVENOUS
  Filled 2020-04-05: qty 1

## 2020-04-05 MED ORDER — IOHEXOL 300 MG/ML  SOLN
100.0000 mL | Freq: Once | INTRAMUSCULAR | Status: AC | PRN
  Administered 2020-04-05: 100 mL via INTRAVENOUS

## 2020-04-05 MED ORDER — MORPHINE SULFATE (PF) 4 MG/ML IV SOLN
4.0000 mg | Freq: Once | INTRAVENOUS | Status: AC
  Administered 2020-04-05: 4 mg via INTRAVENOUS
  Filled 2020-04-05: qty 1

## 2020-04-05 MED FILL — HYDROCODON-APAP 5-325: 5-325 | 2 days supply | Qty: 10 | Fill #0

## 2020-04-05 MED FILL — DOXYCYCLINE HYCLATE 100 MG: 100 | 14 days supply | Qty: 28 | Fill #0

## 2020-04-05 NOTE — ED Provider Notes (Signed)
Pt signed out by Dr. Erin Hearing pending labs, [place], and improvement.  Pt's pelvic and RUQ USs were unremarkable.  And specifically, no evidence of hepatic inflammation c/w Lynnae January Syn.  I ordered a CT abd pelvis which also looks nl.  Pt is feeling much better now and is tolerating po fluids.  She was positive for GC and Chl.  No BV.  She was given rocephin and doxy in the ED and will be d/c home with 2 weeks of doxy for pid tx.  Pt knows to return if worse and to f/u with obgyn.   Jacalyn Lefevre, MD 04/05/20 1104

## 2020-04-05 NOTE — ED Notes (Signed)
Pt returned from u/s, continues to report pain 8/10, urine specimen obtained and sent to lab

## 2020-04-05 NOTE — ED Notes (Signed)
Patient transported to Ultrasound 

## 2020-04-05 NOTE — ED Triage Notes (Signed)
Pt presents with worsening right sided abd pain, denies vomiting and diarrhea. Progressively worse throughout the week. Tested yesterday at [facility] for GC/chlamydia  and denies receiving antibiotics - tests came back positive, will need tx.

## 2020-04-05 NOTE — ED Provider Notes (Signed)
Emergency Department Provider Note   I have reviewed the triage vital signs and the nursing notes.   HISTORY  Chief Complaint Abdominal Pain   HPI Wanda Guzman is a 24 y.o. female who presents to the emergency department today secondary to abdominal pain.  Patient states that for approximately 5 to 6 days she has had progressively worsening abdominal pain.  Started is pelvic in nature in the last 24 hours she started having some right upper quadrant pain as well.  Some nausea but no vomiting.  No diarrhea or constipation.  She had a seizure on the way here because her back also hurt in similar area.  She states that it hurt so bad when she takes a deep breath as well.  I reviewed the records she was seen yesterday done a pelvic exam which was positive for gonorrhea and chlamydia.  No other associated or modifying symptoms.    Past Medical History:  Diagnosis Date  . Asthma   . Chronic migraine w/o aura, not intractable, w/o stat migr    SMOKES    There are no problems to display for this patient.   Past Surgical History:  Procedure Laterality Date  . NO PAST SURGERIES      Current Outpatient Rx  . Order #: [id number] Class: Normal    Allergies Patient has no known allergies.  Family History  Problem Relation Age of Onset  . Healthy Mother   . Hypertension Father   . Hypertension Other   . Diabetes Mellitus I Other     Social History Social History   Tobacco Use  . Smoking status: Current Every Day Smoker  . Smokeless tobacco: Never Used  . Tobacco comment: black and mild  Substance Use Topics  . Alcohol use: Yes    Comment: occasionally  . Drug use: Yes    Frequency: 14.0 times per week    Types: Marijuana    Comment: smokes every day, 1-2 times per day    Review of Systems  All other systems negative except as documented in the HPI. All pertinent positives and negatives as reviewed in the  HPI. ____________________________________________   PHYSICAL EXAM:  VITAL SIGNS: ED Triage Vitals [04/05/20 0640]  Enc Vitals Group     BP 127/71     Pulse Rate 84     Resp 18     Temp 99.8 F (37.7 C)     Temp Source Oral     SpO2 100 %    Constitutional: Alert and oriented. Well appearing and in no acute distress. Eyes: Conjunctivae are normal. PERRL. EOMI. Head: Atraumatic. Nose: No congestion/rhinnorhea. Mouth/Throat: Mucous membranes are moist.  Oropharynx non-erythematous. Neck: No stridor.  No meningeal signs.   Cardiovascular: Normal rate, regular rhythm. Good peripheral circulation. Grossly normal heart sounds.   Respiratory: Normal respiratory effort.  No retractions. Lungs CTAB. Gastrointestinal: Soft and ttp in ruq. No distention.  Musculoskeletal: No lower extremity tenderness nor edema. No gross deformities of extremities. Neurologic:  Normal speech and language. No gross focal neurologic deficits are appreciated.  Skin:  Skin is warm, dry and intact. No rash noted.   ____________________________________________   LABS (all labs ordered are listed, but only abnormal results are displayed)  Labs Reviewed  CBC WITH DIFFERENTIAL/PLATELET  COMPREHENSIVE METABOLIC PANEL  LIPASE, BLOOD  RPR  HIV ANTIBODY (ROUTINE TESTING W REFLEX)   ____________________________________________  EKG   EKG Interpretation  Date/Time:    Ventricular Rate:    PR Interval:  QRS Duration:   QT Interval:    QTC Calculation:   R Axis:     Text Interpretation:         ____________________________________________  RADIOLOGY  No results found.  ____________________________________________   PROCEDURES  Procedure(s) performed:   Procedures   ____________________________________________   INITIAL IMPRESSION / ASSESSMENT AND PLAN / ED COURSE  Likely pelvic inflammatory disease but also concern for possible Lynnae January with a right upper quadrant pain.   We will get an ultrasound of both.  Going to treat with IV antibiotics, fluids and will send some cultures.  Will attempt symptom control and if improved can probably discharge but if evidence of peritonitis or significantly abnormal labs may need to be admitted.  Pertinent labs & imaging results that were available during my care of the patient were reviewed by me and considered in my medical decision making (see chart for details).  ____________________________________________  FINAL CLINICAL IMPRESSION(S) / ED DIAGNOSES  Final diagnoses:  RUQ pain  RUQ pain     MEDICATIONS GIVEN DURING THIS VISIT:  Medications  fentaNYL (SUBLIMAZE) injection 50 mcg (has no administration in time range)  cefTRIAXone (ROCEPHIN) 1 g in sodium chloride 0.9 % 100 mL IVPB (has no administration in time range)  doxycycline (VIBRAMYCIN) 100 mg in sodium chloride 0.9 % 250 mL IVPB (has no administration in time range)  ondansetron (ZOFRAN) injection 4 mg (has no administration in time range)     NEW OUTPATIENT MEDICATIONS STARTED DURING THIS VISIT:  New Prescriptions   No medications on file    Note:  This note was prepared with assistance of Dragon voice recognition software. Occasional wrong-word or sound-a-like substitutions may have occurred due to the inherent limitations of voice recognition software.   Adriano Bischof, Barbara Cower, MD 04/13/20 [phone]

## 2020-04-05 NOTE — ED Notes (Signed)
Pt to u/s.  Reports ongoing pain mildly improved with medication given.

## 2020-04-06 LAB — URINE CULTURE: Culture: 70000 — AB

## 2020-04-08 ENCOUNTER — Telehealth (HOSPITAL_COMMUNITY): Payer: Self-pay

## 2020-04-08 MED ORDER — SULFAMETHOXAZOLE-TRIMETHOPRIM 800-160 MG PO TABS: 1.0000 | ORAL_TABLET | Freq: Two times a day (BID) | ORAL | 0 refills | Status: AC

## 2020-04-10 LAB — CULTURE, BLOOD (ROUTINE X 2)
Culture: NO GROWTH
Culture: NO GROWTH
Special Requests: ADEQUATE
Special Requests: ADEQUATE

## 2020-04-17 MED FILL — DOXYCYCLINE HYCLATE 100 MG: 100 | 5 days supply | Qty: 10 | Fill #0

## 2021-05-08 IMAGING — CT CT ABD-PELV W/ CM
2 of 4 series · 16 of 46 positions shown, 18 images · IV contrast (Omnipaque)
Comparison: 10/31/2018

CLINICAL DATA: Upper abdominal pain over the last week, worsening
over the last few days.

EXAM:
CT ABDOMEN AND PELVIS WITH CONTRAST
TECHNIQUE: Multidetector CT imaging of the abdomen and pelvis was performed
using the standard protocol following bolus administration of
intravenous contrast.
CONTRAST:  100mL OMNIPAQUE IOHEXOL 300 MG/ML  SOLN

[Series 2: axial st · axial · 0.83mm/px · z∈[-470,-70]mm · 13 of 88 slices shown, 15 images]
[im 4/88  soft-tissue]
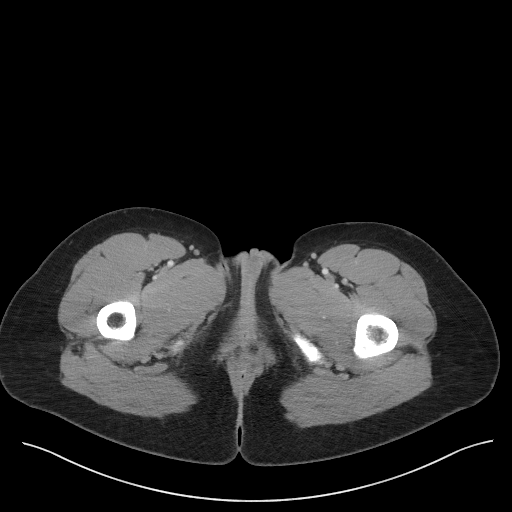
[im 4/88  bone]
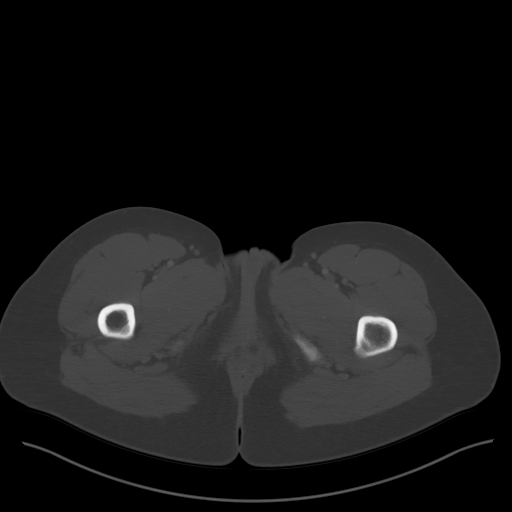
[im 12/88  soft-tissue]
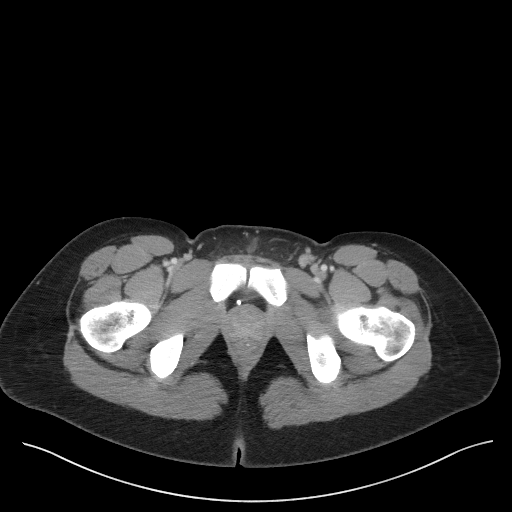
[im 19/88  soft-tissue]
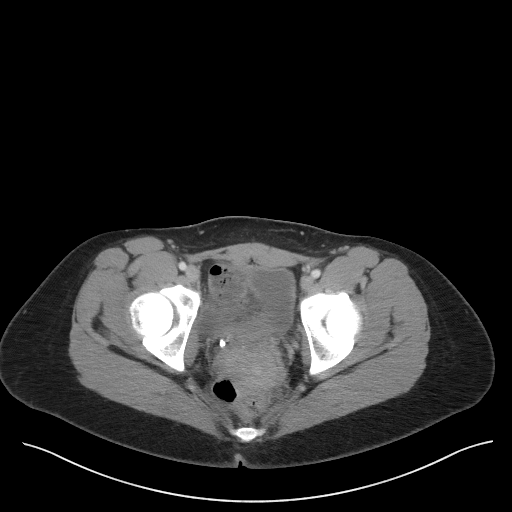
[im 23/88  soft-tissue]
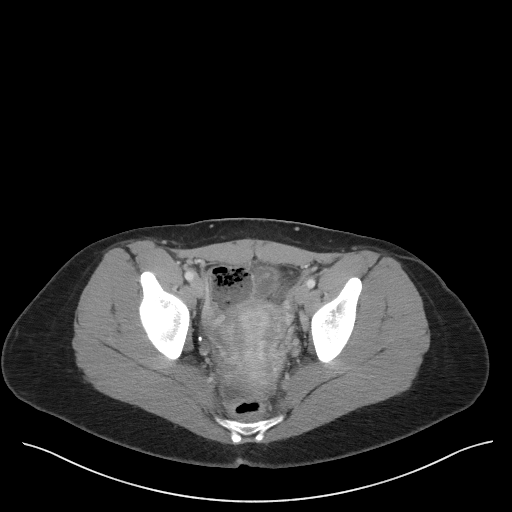
[im 31/88  soft-tissue]
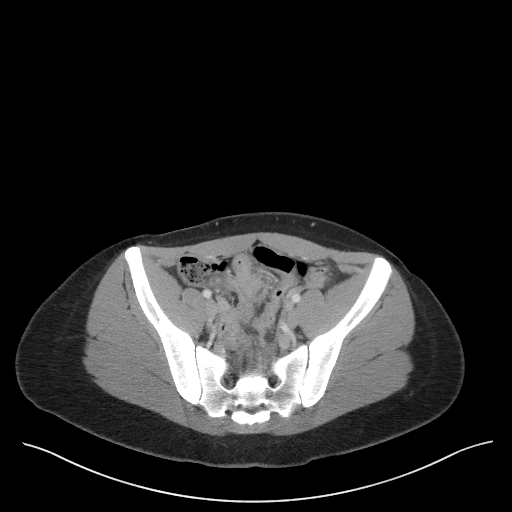
[im 38/88  soft-tissue]
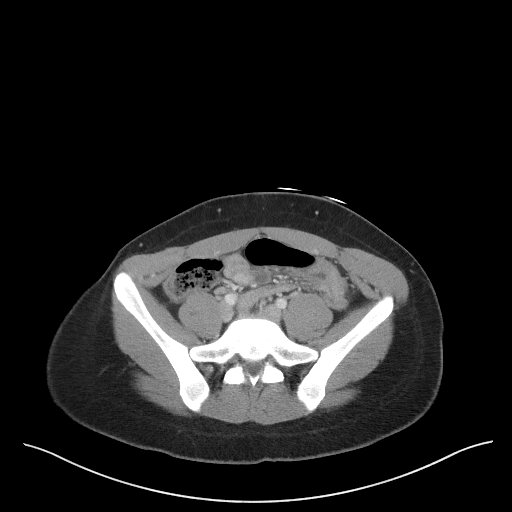
[im 46/88  soft-tissue]
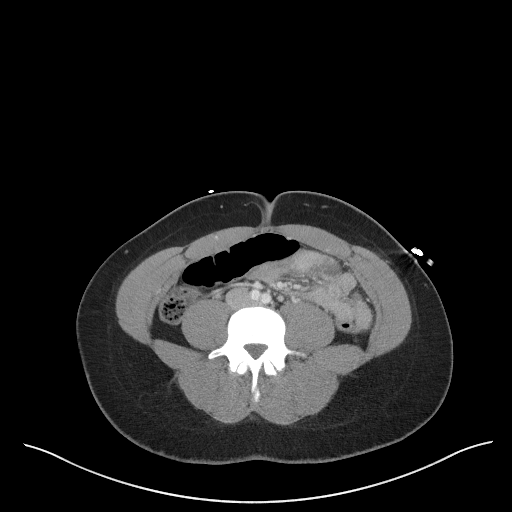
[im 50/88  soft-tissue]
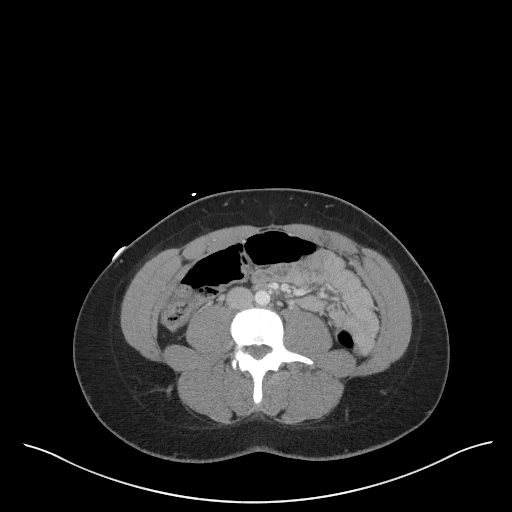
[im 57/88  soft-tissue]
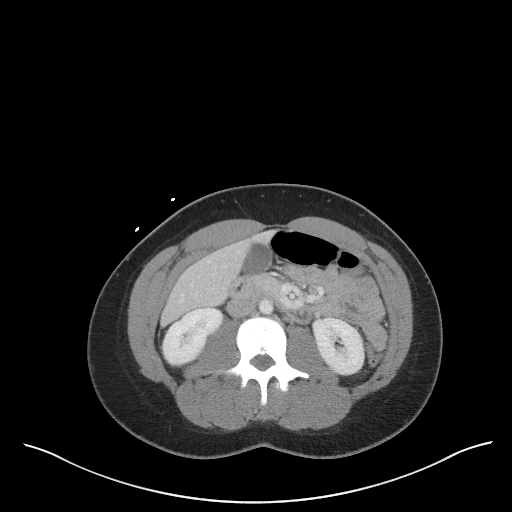
[im 57/88  bone]
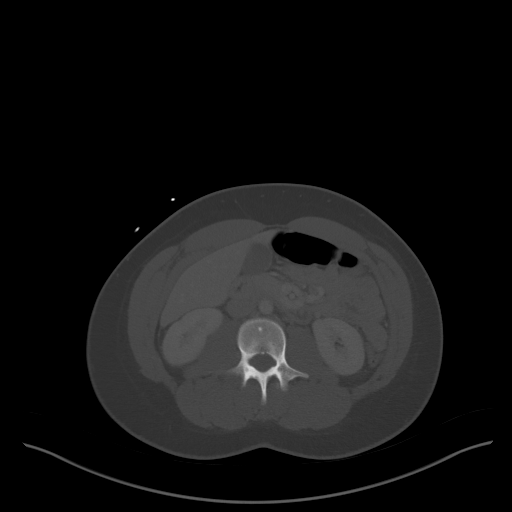
[im 65/88  soft-tissue]
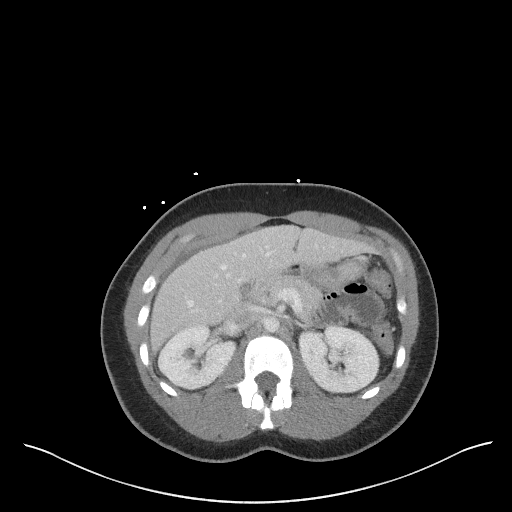
[im 69/88  soft-tissue]
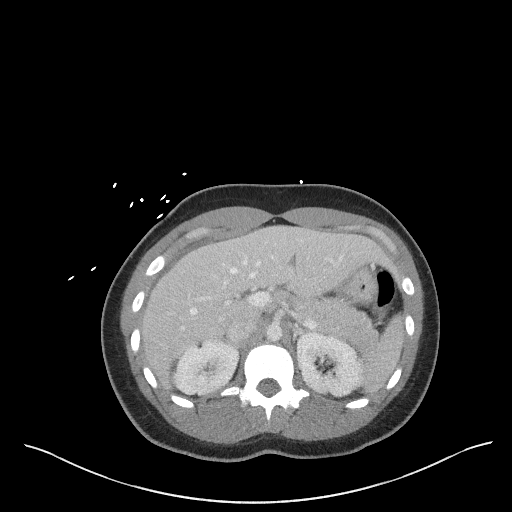
[im 76/88  soft-tissue]
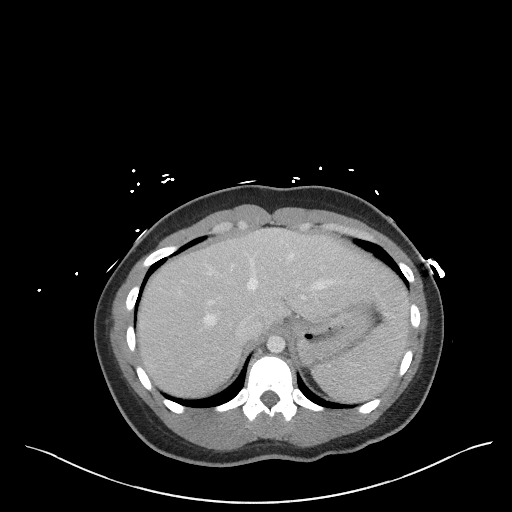
[im 84/88  soft-tissue]
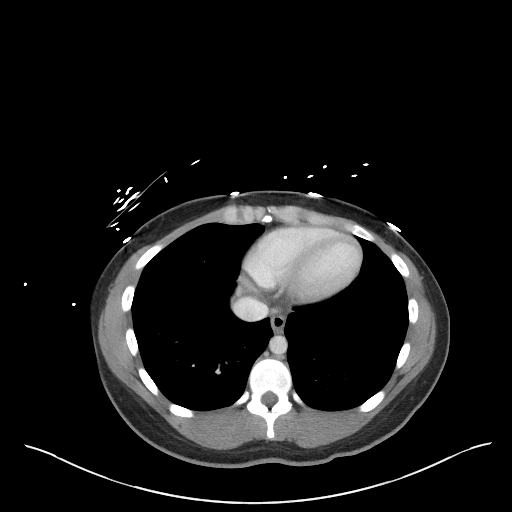

[Series 5: coronal st · coronal · 0.66mm/px · 3 of 81 slices shown]
[im 27/81  soft-tissue]
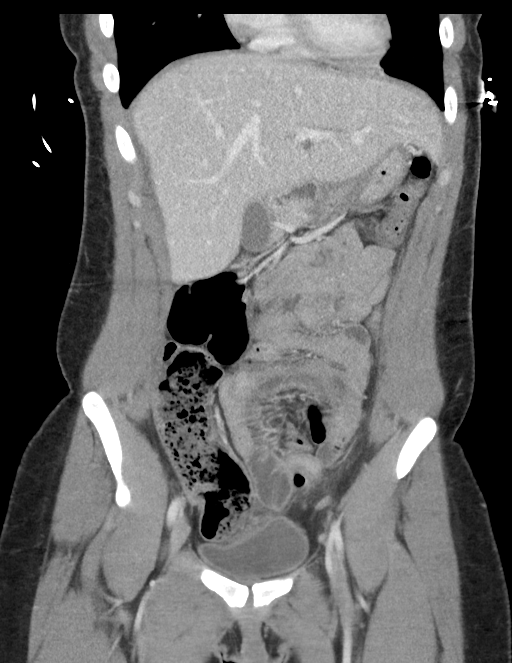
[im 36/81  soft-tissue]
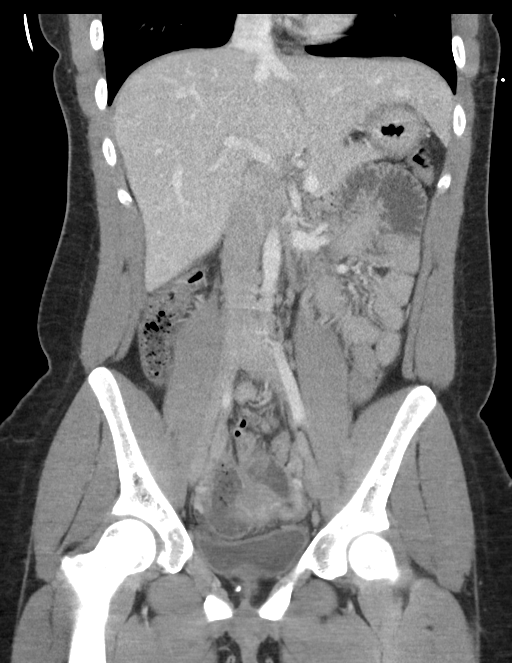
[im 45/81  soft-tissue]
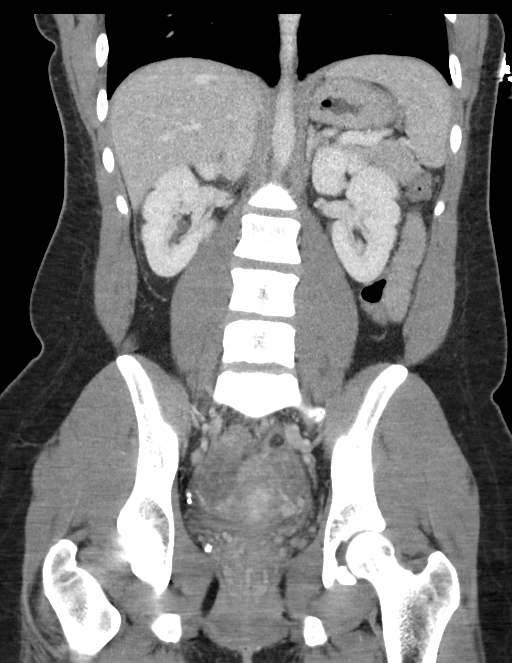

[16 of 46 positions shown; findings below may reference images not displayed]

FINDINGS: Lower chest: Normal

Hepatobiliary: Normal. No sign of calcified gallstones or
gallbladder inflammation by CT.

Pancreas: Normal

Spleen: Normal

Adrenals/Urinary Tract: Adrenal glands are normal. Kidneys are
normal. Bladder is normal.

Stomach/Bowel: No evidence of ileus or obstruction. No evidence of
inflammatory bowel pathology. Normal appearing appendix.

Vascular/Lymphatic: No abnormal vascular finding. No
lymphadenopathy.

Reproductive: Normal

Other: No free fluid or air.

Musculoskeletal: Normal
IMPRESSION: Normal examination. No abnormality seen to explain the presenting
symptoms.

## 2021-08-10 ENCOUNTER — Encounter (HOSPITAL_COMMUNITY): Payer: Self-pay

## 2021-08-10 ENCOUNTER — Other Ambulatory Visit: Payer: Self-pay

## 2021-08-10 ENCOUNTER — Emergency Department (HOSPITAL_COMMUNITY)
Admission: EM | Admit: 2021-08-10 | Discharge: 2021-08-10 | Disposition: A | Discharge status: Home / Self Care | Payer: Self-pay | Attending: Emergency Medicine | Admitting: Emergency Medicine

## 2021-08-10 DIAGNOSIS — J45909 Unspecified asthma, uncomplicated: Secondary | ICD-10-CM | POA: Insufficient documentation

## 2021-08-10 DIAGNOSIS — F17200 Nicotine dependence, unspecified, uncomplicated: Secondary | ICD-10-CM | POA: Insufficient documentation

## 2021-08-10 DIAGNOSIS — S39012A Strain of muscle, fascia and tendon of lower back, initial encounter: Secondary | ICD-10-CM | POA: Insufficient documentation

## 2021-08-10 DIAGNOSIS — Y92410 Unspecified street and highway as the place of occurrence of the external cause: Secondary | ICD-10-CM | POA: Insufficient documentation

## 2021-08-10 DIAGNOSIS — S161XXA Strain of muscle, fascia and tendon at neck level, initial encounter: Secondary | ICD-10-CM | POA: Insufficient documentation

## 2021-08-10 DIAGNOSIS — S39012D Strain of muscle, fascia and tendon of lower back, subsequent encounter: Secondary | ICD-10-CM
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
## 2021-11-01 ENCOUNTER — Other Ambulatory Visit: Payer: Self-pay

## 2021-11-01 ENCOUNTER — Encounter (HOSPITAL_BASED_OUTPATIENT_CLINIC_OR_DEPARTMENT_OTHER): Payer: Self-pay | Admitting: *Deleted

## 2021-11-01 DIAGNOSIS — F17200 Nicotine dependence, unspecified, uncomplicated: Secondary | ICD-10-CM | POA: Insufficient documentation

## 2021-11-01 DIAGNOSIS — J020 Streptococcal pharyngitis: Secondary | ICD-10-CM | POA: Insufficient documentation

## 2021-11-01 DIAGNOSIS — J45909 Unspecified asthma, uncomplicated: Secondary | ICD-10-CM | POA: Insufficient documentation

## 2021-11-01 LAB — GROUP A STREP BY PCR: Group A Strep by PCR: DETECTED — AB

## 2021-11-01 NOTE — ED Triage Notes (Signed)
Sore throat x several days. Denies fever. No difficulty controlling secretions, no change to voice.  Relief with throat drops and OTC aleve.

## 2021-11-02 ENCOUNTER — Emergency Department (HOSPITAL_BASED_OUTPATIENT_CLINIC_OR_DEPARTMENT_OTHER)
Admission: EM | Admit: 2021-11-02 | Discharge: 2021-11-02 | Disposition: A | Discharge status: Home / Self Care | Payer: Medicaid Other | Attending: Emergency Medicine | Admitting: Emergency Medicine

## 2021-11-02 DIAGNOSIS — J020 Streptococcal pharyngitis: Secondary | ICD-10-CM

## 2021-11-02 MED ORDER — IBUPROFEN 400 MG PO TABS
ORAL_TABLET | ORAL | Status: AC
  Administered 2021-11-02: 400 mg
  Filled 2021-11-02: qty 1

## 2021-11-02 MED ORDER — LIDOCAINE VISCOUS HCL 2 % MT SOLN
15.0000 mL | Freq: Once | OROMUCOSAL | Status: AC
  Administered 2021-11-02: 15 mL via OROMUCOSAL

## 2021-11-02 MED ORDER — PENICILLIN G BENZATHINE 1200000 UNIT/2ML IM SUSY
1.2000 10*6.[IU] | PREFILLED_SYRINGE | Freq: Once | INTRAMUSCULAR | Status: AC
  Administered 2021-11-02: 1.2 10*6.[IU] via INTRAMUSCULAR

## 2021-11-02 NOTE — ED Provider Notes (Signed)
[facility] EMERGENCY DEPARTMENT Provider Note   CSN: [id number] Arrival date & time: 11/01/21  2009     History Chief Complaint  Patient presents with   Sore Throat    Wanda Guzman is a 25 y.o. female.  The history is provided by the patient.  Sore Throat  Wanda Guzman is a 25 y.o. female who presents to the Emergency Department complaining of sore throat. She presents to the ED for evaluation of sore throat that started a few days ago, significantly worsened today.  Pain is bilateral but worse on the right.  Has pain with swallowing but able to swallow.  No SOB, N/V, fevers.  No known medical problems.  Sxs are moderate and constant in nature.     Past Medical History:  Diagnosis Date   Asthma    Chronic migraine w/o aura, not intractable, w/o stat migr    SMOKES    There are no problems to display for this patient.   Past Surgical History:  Procedure Laterality Date   NO PAST SURGERIES       OB History   No obstetric history on file.     Family History  Problem Relation Age of Onset   Healthy Mother    Hypertension Father    Hypertension Other    Diabetes Mellitus I Other     Social History   Tobacco Use   Smoking status: Every Day   Smokeless tobacco: Never   Tobacco comments:    black and mild  Vaping Use   Vaping Use: Never used  Substance Use Topics   Alcohol use: Yes    Comment: occasionally   Drug use: Yes    Frequency: 14.0 times per week    Types: Marijuana    Comment: smokes every day, 1-2 times per day    Home Medications Prior to Admission medications   Medication Sig Start Date End Date Taking? Authorizing Provider  acetaminophen (TYLENOL) 500 MG tablet Take 1,000 mg by mouth every 6 (six) hours as needed for mild pain or moderate pain.    [provider]  topiramate ER (QUDEXY XR) CS24 sprinkle capsule Take 100 mg by mouth daily.  09/20/19  [provider]    Allergies    Patient has no known  allergies.  Review of Systems   Review of Systems  All other systems reviewed and are negative.  Physical Exam Updated Vital Signs BP 124/87 (BP Location: Right Arm)   Pulse 66   Temp 98.3 F (36.8 C) (Oral)   Resp 18   Ht 5\' 2"  (1.575 m)   Wt 79.4 kg   LMP 10/21/2021   SpO2 100%   BMI 32.01 kg/m   Physical Exam Vitals and nursing note reviewed.  Constitutional:      Appearance: She is well-developed.  HENT:     Head: Normocephalic and atraumatic.     Comments: Moderate erythema to the tonsils bilaterally without significant exudate. Mild tonsilar swelling bilaterally Neck:     Comments: Mild bilateral anterior cervical lymphadenopathy, right greater than left. Cardiovascular:     Rate and Rhythm: Normal rate and regular rhythm.     Heart sounds: No murmur heard. Pulmonary:     Effort: Pulmonary effort is normal. No respiratory distress.     Breath sounds: Normal breath sounds.  Musculoskeletal:        General: No tenderness.     Cervical back: Neck supple.  Skin:    General:  Skin is warm and dry.  Neurological:     Mental Status: She is alert and oriented to person, place, and time.  Psychiatric:        Behavior: Behavior normal.    ED Results / Procedures / Treatments   Labs (all labs ordered are listed, but only abnormal results are displayed) Labs Reviewed  GROUP A STREP BY PCR - Abnormal; Notable for the following components:      Result Value   Group A Strep by PCR DETECTED (*)    All other components within normal limits    EKG None  Radiology No results found.  Procedures Procedures   Medications Ordered in ED Medications  ibuprofen (ADVIL) 400 MG tablet (400 mg  Given 11/02/21 0158)    ED Course  I have reviewed the triage vital signs and the nursing notes.  Pertinent labs & imaging results that were available during my care of the patient were reviewed by me and considered in my medical decision making (see chart for details).     MDM Rules/Calculators/A&P                          patient here for evaluation of sore throat. She is non-toxic appearing on evaluation with no respiratory distress. She is well hydrated. She is tolerating her secretions without difficulty. No evidence of RPA, PTA, epiglotitis. She is positive for strep pharyngitis today. Will treat with penicillin with outpatient follow-up and return precautions.  Final Clinical Impression(s) / ED Diagnoses Final diagnoses:  Strep throat    Rx / DC Orders ED Discharge Orders     None        Tilden Fossa, MD 11/02/21 0206

## 2022-02-09 ENCOUNTER — Other Ambulatory Visit: Payer: Self-pay

## 2022-02-09 ENCOUNTER — Encounter (HOSPITAL_BASED_OUTPATIENT_CLINIC_OR_DEPARTMENT_OTHER): Payer: Self-pay

## 2022-02-09 ENCOUNTER — Emergency Department (HOSPITAL_BASED_OUTPATIENT_CLINIC_OR_DEPARTMENT_OTHER)
Admission: EM | Admit: 2022-02-09 | Discharge: 2022-02-09 | Disposition: A | Discharge status: Home / Self Care | Payer: Self-pay | Attending: Emergency Medicine | Admitting: Emergency Medicine

## 2022-02-09 ENCOUNTER — Emergency Department (HOSPITAL_BASED_OUTPATIENT_CLINIC_OR_DEPARTMENT_OTHER): Payer: Self-pay

## 2022-02-09 DIAGNOSIS — R110 Nausea: Secondary | ICD-10-CM | POA: Insufficient documentation

## 2022-02-09 DIAGNOSIS — R1011 Right upper quadrant pain: Secondary | ICD-10-CM | POA: Insufficient documentation

## 2022-02-09 DIAGNOSIS — Z20822 Contact with and (suspected) exposure to covid-19: Secondary | ICD-10-CM | POA: Insufficient documentation

## 2022-02-09 DIAGNOSIS — R109 Unspecified abdominal pain: Secondary | ICD-10-CM

## 2022-02-09 DIAGNOSIS — R630 Anorexia: Secondary | ICD-10-CM | POA: Insufficient documentation

## 2022-02-09 DIAGNOSIS — J45909 Unspecified asthma, uncomplicated: Secondary | ICD-10-CM | POA: Insufficient documentation

## 2022-02-09 LAB — CBC
HCT: 42.1 % (ref 36.0–46.0)
Hemoglobin: 13.5 g/dL (ref 12.0–15.0)
MCH: 27.7 pg (ref 26.0–34.0)
MCHC: 32.1 g/dL (ref 30.0–36.0)
MCV: 86.4 fL (ref 80.0–100.0)
Platelets: 243 10*3/uL (ref 150–400)
RBC: 4.87 MIL/uL (ref 3.87–5.11)
RDW: 14.6 % (ref 11.5–15.5)
WBC: 9.2 10*3/uL (ref 4.0–10.5)
nRBC: 0 % (ref 0.0–0.2)

## 2022-02-09 LAB — COMPREHENSIVE METABOLIC PANEL
ALT: 17 U/L (ref 0–44)
AST: 24 U/L (ref 15–41)
Albumin: 4.2 g/dL (ref 3.5–5.0)
Alkaline Phosphatase: 46 U/L (ref 38–126)
Anion gap: 10 (ref 5–15)
BUN: 11 mg/dL (ref 6–20)
CO2: 26 mmol/L (ref 22–32)
Calcium: 9.3 mg/dL (ref 8.9–10.3)
Chloride: 101 mmol/L (ref 98–111)
Creatinine, Ser: 0.95 mg/dL (ref 0.44–1.00)
GFR, Estimated: >60 mL/min (ref >60)
Glucose, Bld: 96 mg/dL (ref 70–99)
Potassium: 3.8 mmol/L (ref 3.5–5.1)
Sodium: 137 mmol/L (ref 135–145)
Total Bilirubin: 0.7 mg/dL (ref 0.3–1.2)
Total Protein: 7.1 g/dL (ref 6.5–8.1)

## 2022-02-09 LAB — RESP PANEL BY RT-PCR (FLU A&B, COVID) ARPGX2
Influenza A by PCR: NEGATIVE
Influenza B by PCR: NEGATIVE
SARS Coronavirus 2 by RT PCR: NEGATIVE

## 2022-02-09 LAB — URINALYSIS, ROUTINE W REFLEX MICROSCOPIC
Bilirubin Urine: NEGATIVE
Glucose, UA: NEGATIVE mg/dL
Hgb urine dipstick: NEGATIVE
Ketones, ur: NEGATIVE mg/dL
Nitrite: NEGATIVE
Protein, ur: NEGATIVE mg/dL
Specific Gravity, Urine: >=1.03 (ref 1.005–1.030)
pH: 6 (ref 5.0–8.0)

## 2022-02-09 LAB — URINALYSIS, MICROSCOPIC (REFLEX)

## 2022-02-09 LAB — PREGNANCY, URINE: Preg Test, Ur: NEGATIVE

## 2022-02-09 LAB — LIPASE, BLOOD: Lipase: 29 U/L (ref 11–51)

## 2022-02-09 MED ORDER — SUCRALFATE 1 G PO TABS: 1.0000 g | ORAL_TABLET | Freq: Three times a day (TID) | ORAL | 0 refills | Status: DC

## 2022-02-09 MED ORDER — ONDANSETRON HCL 4 MG/2ML IJ SOLN
4.0000 mg | Freq: Once | INTRAMUSCULAR | Status: AC
  Administered 2022-02-09: 4 mg via INTRAVENOUS
  Filled 2022-02-09: qty 2

## 2022-02-09 MED ORDER — SODIUM CHLORIDE 0.9 % IV BOLUS
1000.0000 mL | Freq: Once | INTRAVENOUS | Status: AC
  Administered 2022-02-09: 1000 mL via INTRAVENOUS

## 2022-02-09 MED ORDER — LIDOCAINE VISCOUS HCL 2 % MT SOLN
15.0000 mL | Freq: Once | OROMUCOSAL | Status: AC
  Administered 2022-02-09: 15 mL via ORAL
  Filled 2022-02-09: qty 15

## 2022-02-09 MED ORDER — IOHEXOL 300 MG/ML  SOLN
100.0000 mL | Freq: Once | INTRAMUSCULAR | Status: AC | PRN
  Administered 2022-02-09: 100 mL via INTRAVENOUS

## 2022-02-09 MED ORDER — FAMOTIDINE IN NACL 20-0.9 MG/50ML-% IV SOLN
20.0000 mg | Freq: Once | INTRAVENOUS | Status: AC
Start: 2022-02-09 — End: 2022-02-09
  Administered 2022-02-09: 20 mg via INTRAVENOUS
  Filled 2022-02-09: qty 50

## 2022-02-09 MED ORDER — ALUM & MAG HYDROXIDE-SIMETH 200-200-20 MG/5ML PO SUSP
30.0000 mL | Freq: Once | ORAL | Status: AC
  Administered 2022-02-09: 30 mL via ORAL
  Filled 2022-02-09: qty 30

## 2022-02-09 MED ORDER — DICYCLOMINE HCL 20 MG PO TABS: 20.0000 mg | ORAL_TABLET | Freq: Two times a day (BID) | ORAL | 0 refills | Status: DC | PRN

## 2022-02-09 MED ORDER — KETOROLAC TROMETHAMINE 15 MG/ML IJ SOLN
15.0000 mg | Freq: Once | INTRAMUSCULAR | Status: AC
  Administered 2022-02-09: 15 mg via INTRAVENOUS
  Filled 2022-02-09: qty 1

## 2022-02-09 MED ORDER — DICYCLOMINE HCL 10 MG PO CAPS
10.0000 mg | ORAL_CAPSULE | Freq: Once | ORAL | Status: AC
  Administered 2022-02-09: 10 mg via ORAL
  Filled 2022-02-09: qty 1

## 2022-02-09 NOTE — ED Notes (Signed)
Urine culture added to urine in lab

## 2022-02-09 NOTE — ED Notes (Signed)
Patient updated on plan of care.  Will d/c once IV fluids have completed

## 2022-02-09 NOTE — ED Provider Notes (Signed)
[facility] Provider Note   CSN: [id number] Arrival date & time: 02/09/22  1417     History  Chief Complaint  Patient presents with   Abdominal Pain    Wanda Guzman is a 26 y.o. female.  This is a 26 y.o. female with significant medical history as below, including asthma, migraine who presents to the ED with complaint of abdominal pain.  Location: Epigastric, left flank, right upper quadrant Duration: 48 hours Onset: Sudden Timing: Intermittent Description: Sharp, aching, stabbing Severity: Mild Exacerbating/Alleviating Factors:  Mild improvement with Motrin/Tylenol. Associated Symptoms: Mild nausea, poor appetite Pertinent Negatives: No fevers, chills, vomiting, change in bowel or bladder function, no dysuria or hematuria.  No IV drug use, no trauma, no recent travel, no history abdominal surgery, no concern for STI.  No excessive alcohol use.  No diet changes.     Past Medical History: No date: Asthma No date: Chronic migraine w/o aura, not intractable, w/o stat migr     Comment:  SMOKES  Past Surgical History: No date: NO PAST SURGERIES    The history is provided by the patient. No language interpreter was used.  Abdominal Pain Associated symptoms: nausea   Associated symptoms: no chest pain, no cough, no dysuria, no fever and no shortness of breath       Home Medications Prior to Admission medications   Medication Sig Start Date End Date Taking? Authorizing Provider  dicyclomine (BENTYL) 20 MG tablet Take 1 tablet (20 mg total) by mouth 2 (two) times daily as needed for spasms. 02/09/22  Yes Wynona Dove A, DO  sucralfate (CARAFATE) 1 g tablet Take 1 tablet (1 g total) by mouth 4 (four) times daily -  with meals and at bedtime for 5 days. 02/09/22 02/14/22 Yes Jeanell Sparrow, DO  acetaminophen (TYLENOL) 500 MG tablet Take 1,000 mg by mouth every 6 (six) hours as needed for mild pain or moderate pain.    [provider]   topiramate ER (QUDEXY XR) CS24 sprinkle capsule Take 100 mg by mouth daily.  09/20/19  [provider]      Allergies    Patient has no known allergies.    Review of Systems   Review of Systems  Constitutional:  Negative for activity change and fever.  HENT:  Negative for facial swelling and trouble swallowing.   Eyes:  Negative for discharge and redness.  Respiratory:  Negative for cough and shortness of breath.   Cardiovascular:  Negative for chest pain and palpitations.  Gastrointestinal:  Positive for abdominal pain and nausea.  Genitourinary:  Negative for dysuria and flank pain.  Musculoskeletal:  Negative for back pain and gait problem.  Skin:  Negative for pallor and rash.  Neurological:  Negative for syncope and headaches.   Physical Exam Updated Vital Signs BP 120/86 (BP Location: Right Arm)    Pulse 61    Temp 98.6 F (37 C) (Oral)    Resp 18    Ht 5\' 6"  (1.676 m)    Wt 84.4 kg    LMP 01/27/2022    SpO2 100%    BMI 30.02 kg/m  Physical Exam Vitals and nursing note reviewed.  Constitutional:      General: She is not in acute distress.    Appearance: Normal appearance. She is well-developed. She is not ill-appearing, toxic-appearing or diaphoretic.  HENT:     Head: Normocephalic and atraumatic.     Right Ear: External ear normal.  Left Ear: External ear normal.     Nose: Nose normal.     Mouth/Throat:     Mouth: Mucous membranes are moist.  Eyes:     General: No scleral icterus.       Right eye: No discharge.        Left eye: No discharge.  Cardiovascular:     Rate and Rhythm: Normal rate and regular rhythm.     Pulses: Normal pulses.     Heart sounds: Normal heart sounds.  Pulmonary:     Effort: Pulmonary effort is normal. No respiratory distress.     Breath sounds: Normal breath sounds.  Abdominal:     General: Abdomen is flat. Bowel sounds are normal.     Palpations: Abdomen is soft.     Tenderness: There is abdominal tenderness in the  epigastric area. There is no guarding or rebound. Negative signs include Rovsing's sign and McBurney's sign.  Musculoskeletal:        General: Normal range of motion.     Cervical back: Normal range of motion.     Right lower leg: No edema.     Left lower leg: No edema.  Skin:    General: Skin is warm and dry.     Capillary Refill: Capillary refill takes less than 2 seconds.  Neurological:     Mental Status: She is alert.  Psychiatric:        Mood and Affect: Mood normal.        Behavior: Behavior normal.    ED Results / Procedures / Treatments   Labs (all labs ordered are listed, but only abnormal results are displayed) Labs Reviewed  URINALYSIS, ROUTINE W REFLEX MICROSCOPIC - Abnormal; Notable for the following components:      Result Value   Leukocytes,Ua TRACE (*)    All other components within normal limits  URINALYSIS, MICROSCOPIC (REFLEX) - Abnormal; Notable for the following components:   Bacteria, UA FEW (*)    All other components within normal limits  RESP PANEL BY RT-PCR (FLU A&B, COVID) ARPGX2  URINE CULTURE  LIPASE, BLOOD  COMPREHENSIVE METABOLIC PANEL  CBC  PREGNANCY, URINE    EKG None  Radiology CT ABDOMEN PELVIS W CONTRAST  Result Date: 02/09/2022 CLINICAL DATA:  Abdominal pain, acute, nonlocalized epig, left flank EXAM: CT ABDOMEN AND PELVIS WITH CONTRAST TECHNIQUE: Multidetector CT imaging of the abdomen and pelvis was performed using the standard protocol following bolus administration of intravenous contrast. RADIATION DOSE REDUCTION: This exam was performed according to the departmental dose-optimization program which includes automated exposure control, adjustment of the mA and/or kV according to patient size and/or use of iterative reconstruction technique. CONTRAST:  111mL OMNIPAQUE IOHEXOL 300 MG/ML  SOLN COMPARISON:  CT 04/05/2020 FINDINGS: Lower chest: No acute abnormality. Hepatobiliary: No focal liver abnormality is seen. The gallbladder is  unremarkable. Pancreas: Unremarkable. No pancreatic ductal dilatation or surrounding inflammatory changes. Spleen: Normal in size without focal abnormality. Adrenals/Urinary Tract: Adrenal glands are unremarkable. No hydronephrosis or nephrolithiasis. The bladder is minimally distended. Stomach/Bowel: The stomach is within normal limits. There is no evidence of bowel obstruction.The appendix is normal. Vascular/Lymphatic: No significant vascular findings are present. No enlarged abdominal or pelvic lymph nodes. Reproductive: Unremarkable for age. Other: Trace free fluid in the pelvis, likely physiologic. Musculoskeletal: No acute osseous abnormality. Partially lumbarized S1 with rudimentary disc at S1-S2. There is a benign bone island in the L3 vertebrae. Benign bone island in the right sacrum. No suspicious lytic or blastic  lesion. IMPRESSION: No acute abdominopelvic abnormality.  Normal appendix. Electronically Signed   By: Maurine Simmering M.D.   On: 02/09/2022 18:48    Procedures Procedures    Medications Ordered in ED Medications  famotidine (PEPCID) IVPB 20 mg premix (20 mg Intravenous New Bag/Given 02/09/22 1756)  alum & mag hydroxide-simeth (MAALOX/MYLANTA) 200-200-20 MG/5ML suspension 30 mL (30 mLs Oral Given 02/09/22 1800)    And  lidocaine (XYLOCAINE) 2 % viscous mouth solution 15 mL (15 mLs Oral Given 02/09/22 1800)  dicyclomine (BENTYL) capsule 10 mg (10 mg Oral Given 02/09/22 1759)  ondansetron (ZOFRAN) injection 4 mg (4 mg Intravenous Given 02/09/22 1753)  sodium chloride 0.9 % bolus 1,000 mL (1,000 mLs Intravenous New Bag/Given 02/09/22 1749)  ketorolac (TORADOL) 15 MG/ML injection 15 mg (15 mg Intravenous Given 02/09/22 1754)  iohexol (OMNIPAQUE) 300 MG/ML solution 100 mL (100 mLs Intravenous Contrast Given 02/09/22 1812)    ED Course/ Medical Decision Making/ A&P                           Medical Decision Making Amount and/or Complexity of Data Reviewed Independent Historian:  parent External Data Reviewed: labs, radiology and notes. Labs: ordered. Decision-making details documented in ED Course. Radiology: ordered. Decision-making details documented in ED Course.  Risk OTC drugs. Prescription drug management.    CC: Abdominal pain  This patient presents to the Emergency Department for the above complaint. This involves an extensive number of treatment options and is a complaint that carries with it a high risk of complications and morbidity. Vital signs were reviewed. Serious etiologies considered.  Record review:  Previous records obtained and reviewed   Medical and surgical history as noted above.   Work up as above, notable for:  Labs & imaging results that were available during my care of the patient were reviewed by me and considered in my medical decision making.   I ordered imaging studies which included CT abdomen pelvis and I reviewed imaging which showed no acute process  Social determinants of health include - tobacco use   Counseled patient for approximately 3 minutes regarding smoking cessation. Discussed risks of smoking and how they applied and affected their visit here today. Patient not ready to quit at this time, however will follow up with their primary doctor when they are.   CPT code: [id number]: intermediate counseling for smoking cessation   Management: GI cocktail, IV fluids and antiemetics  Reassessment:   Feeling better, able to tolerate p.o.  No ongoing nausea or vomiting.  Abdominal exam is soft, nontender, nonperitoneal  Urinalysis is borderline for UTI.  She has no dysuria or hematuria or suprapubic discomfort.  Low suspicion for UTI.  Will not begin treatment.  We will send urine culture.  The patient's overall condition has improved, the patient presents with abdominal pain without signs of peritonitis, or other life-threatening serious etiology. The patient understands that at this time there is no evidence for a  more malignant underlying process, but the patient also understands that early in the process of an illness, an emergency department workup can be falsely reassuring. Detailed discussions were had with the patient regarding current findings, and need for close f/u with PCP or on call doctor. The patient appears stable for discharge and has been instructed to return immediately if the symptoms worsen in any way for re-evaluation. Patient verbalized understanding and is in agreement with current care plan.  All questions answered prior to  discharge.       This chart was dictated using voice recognition software.  Despite best efforts to proofread,  errors can occur which can change the documentation meaning.         Final Clinical Impression(s) / ED Diagnoses Final diagnoses:  Abdominal pain, unspecified abdominal location    Rx / DC Orders ED Discharge Orders          Ordered    sucralfate (CARAFATE) 1 g tablet  3 times daily with meals & bedtime        02/09/22 1928    dicyclomine (BENTYL) 20 MG tablet  2 times daily PRN        02/09/22 1928              Jeanell Sparrow, DO 02/09/22 1929

## 2022-02-09 NOTE — ED Triage Notes (Signed)
Pt c/o abd pain x 3 days-NAD-steady gait 

## 2022-02-09 NOTE — Discharge Instructions (Addendum)

## 2022-02-11 LAB — URINE CULTURE: Culture: 10000 — AB

## 2022-03-12 DIAGNOSIS — Z0389 Encounter for observation for other suspected diseases and conditions ruled out: Secondary | ICD-10-CM | POA: Diagnosis not present

## 2022-03-12 DIAGNOSIS — Z3009 Encounter for other general counseling and advice on contraception: Secondary | ICD-10-CM | POA: Diagnosis not present

## 2022-03-12 DIAGNOSIS — Z1388 Encounter for screening for disorder due to exposure to contaminants: Secondary | ICD-10-CM | POA: Diagnosis not present

## 2023-12-07 ENCOUNTER — Emergency Department (HOSPITAL_COMMUNITY): Payer: Medicaid Other

## 2023-12-07 ENCOUNTER — Other Ambulatory Visit: Payer: Self-pay

## 2023-12-07 ENCOUNTER — Encounter (HOSPITAL_COMMUNITY): Payer: Self-pay

## 2023-12-07 ENCOUNTER — Emergency Department (HOSPITAL_COMMUNITY)
Admission: EM | Admit: 2023-12-07 | Discharge: 2023-12-07 | Disposition: A | Discharge status: Home / Self Care | Payer: Medicaid Other | Attending: Emergency Medicine | Admitting: Emergency Medicine

## 2023-12-07 DIAGNOSIS — S61051A Open bite of right thumb without damage to nail, initial encounter: Secondary | ICD-10-CM | POA: Diagnosis present

## 2023-12-07 DIAGNOSIS — M79643 Pain in unspecified hand: Secondary | ICD-10-CM | POA: Diagnosis not present

## 2023-12-07 DIAGNOSIS — Z23 Encounter for immunization: Secondary | ICD-10-CM | POA: Insufficient documentation

## 2023-12-07 DIAGNOSIS — J45909 Unspecified asthma, uncomplicated: Secondary | ICD-10-CM | POA: Insufficient documentation

## 2023-12-07 DIAGNOSIS — S81051A Open bite, right knee, initial encounter: Secondary | ICD-10-CM | POA: Diagnosis not present

## 2023-12-07 DIAGNOSIS — S8001XA Contusion of right knee, initial encounter: Secondary | ICD-10-CM | POA: Diagnosis not present

## 2023-12-07 DIAGNOSIS — Z2914 Encounter for prophylactic rabies immune globin: Secondary | ICD-10-CM | POA: Insufficient documentation

## 2023-12-07 DIAGNOSIS — S50811A Abrasion of right forearm, initial encounter: Secondary | ICD-10-CM | POA: Diagnosis not present

## 2023-12-07 DIAGNOSIS — W540XXA Bitten by dog, initial encounter: Secondary | ICD-10-CM | POA: Diagnosis not present

## 2023-12-07 DIAGNOSIS — S60413A Abrasion of left middle finger, initial encounter: Secondary | ICD-10-CM | POA: Insufficient documentation

## 2023-12-07 DIAGNOSIS — W5581XA Bitten by other mammals, initial encounter: Secondary | ICD-10-CM | POA: Diagnosis not present

## 2023-12-07 DIAGNOSIS — W19XXXA Unspecified fall, initial encounter: Secondary | ICD-10-CM

## 2023-12-07 DIAGNOSIS — S51851A Open bite of right forearm, initial encounter: Secondary | ICD-10-CM | POA: Diagnosis not present

## 2023-12-07 DIAGNOSIS — S31050A Open bite of lower back and pelvis without penetration into retroperitoneum, initial encounter: Secondary | ICD-10-CM | POA: Diagnosis not present

## 2023-12-07 DIAGNOSIS — S61452A Open bite of left hand, initial encounter: Secondary | ICD-10-CM | POA: Diagnosis not present

## 2023-12-07 DIAGNOSIS — S61451A Open bite of right hand, initial encounter: Secondary | ICD-10-CM | POA: Diagnosis not present

## 2023-12-07 LAB — PREGNANCY, URINE: Preg Test, Ur: NEGATIVE

## 2023-12-07 MED ORDER — AMOXICILLIN-POT CLAVULANATE 875-125 MG PO TABS: 1.0000 | ORAL_TABLET | Freq: Two times a day (BID) | ORAL | 0 refills | Status: DC

## 2023-12-07 MED ORDER — RABIES VACCINE, PCEC IM SUSR
1.0000 mL | Freq: Once | INTRAMUSCULAR | Status: AC
  Administered 2023-12-07: 1 mL via INTRAMUSCULAR
  Filled 2023-12-07: qty 1

## 2023-12-07 MED ORDER — AMOXICILLIN-POT CLAVULANATE 875-125 MG PO TABS
1.0000 | ORAL_TABLET | Freq: Once | ORAL | Status: AC
  Administered 2023-12-07: 1 via ORAL
  Filled 2023-12-07: qty 1

## 2023-12-07 MED ORDER — RABIES IMMUNE GLOBULIN 150 UNIT/ML IM INJ
20.0000 [IU]/kg | INJECTION | Freq: Once | INTRAMUSCULAR | Status: AC
  Administered 2023-12-07: 1650 [IU] via INTRAMUSCULAR
  Filled 2023-12-07: qty 12

## 2023-12-07 NOTE — ED Triage Notes (Signed)
BIBA puncture wound from dog bite noted to right thumb and abrasion to right knee. Bleeding controlled. Unk if dog vaccinated. Animal controlled on scene per EMS.

## 2023-12-07 NOTE — ED Provider Notes (Signed)
Emergency Department Provider Note   I have reviewed the triage vital signs and the nursing notes.   HISTORY  Chief Complaint Animal Bite   HPI Wanda Guzman is a 27 y.o. female past history of asthma presents to the emergency department after dog bite to her hand, forearm, knee.  The dogs belong to her neighbor who are able to jump the fence and came after her.  She was trying to get away from them and fell causing additional pain to her right knee as well as her lower back.  Bleeding is stopped.  Tetanus is within the last 5 years.  Vaccine status of the dog is unknown.    Past Medical History:  Diagnosis Date   Asthma    Chronic migraine w/o aura, not intractable, w/o stat migr    SMOKES    Review of Systems  Constitutional: No fever/chills Cardiovascular: Denies chest pain. Respiratory: Denies shortness of breath. Gastrointestinal: No abdominal pain.  Genitourinary: Negative for dysuria. Musculoskeletal: Positive hand and knee pain.  Skin: Abrasions to extremities.  Neurological: Negative for headaches. No numbness.   ____________________________________________   PHYSICAL EXAM:  VITAL SIGNS: ED Triage Vitals  Encounter Vitals Group     BP 12/07/23 1819 130/61     Pulse Rate 12/07/23 1819 85     Resp 12/07/23 1819 18     Temp 12/07/23 1819 98.4 F (36.9 C)     Temp Source 12/07/23 1819 Oral     SpO2 12/07/23 1819 99 %     Weight 12/07/23 1818 185 lb 3 oz (84 kg)     Height 12/07/23 1818 5\' 6"  (1.676 m)   Constitutional: Alert and oriented. Well appearing and in no acute distress. Eyes: Conjunctivae are normal.  Head: Atraumatic. Nose: No congestion/rhinnorhea. Mouth/Throat: Mucous membranes are moist.  Neck: No stridor.   Cardiovascular: Normal rate, regular rhythm. Good peripheral circulation. Grossly normal heart sounds.   Respiratory: Normal respiratory effort.  No retractions. Lungs CTAB. Gastrointestinal: Soft and nontender. No distention.   Musculoskeletal: No lower extremity tenderness nor edema. No gross deformities of extremities. Neurologic:  Normal speech and language. No gross focal neurologic deficits are appreciated.  Skin:  Skin is warm and dry.  Small puncture wound to the palmar surface of the right thumb.  Abrasion to the left middle finger.  Abrasion to the distal forearm on the right as well as abrasion with some focal tenderness to the right patella.  ____________________________________________   LABS (all labs ordered are listed, but only abnormal results are displayed)  Labs Reviewed  PREGNANCY, URINE   ____________________________________________  RADIOLOGY  DG Knee 2 Views Right  Result Date: 12/07/2023 CLINICAL DATA:  Dog bite, right knee abrasion EXAM: RIGHT KNEE - 1-2 VIEW COMPARISON:  None Available. FINDINGS: No fracture or dislocation is seen. The joint spaces are preserved. The visualized soft tissues are unremarkable. No suprapatellar knee joint effusion. No radiopaque foreign body is seen. IMPRESSION: Negative. Electronically Signed   By: Charline Bills M.D.   On: 12/07/2023 22:40   DG Lumbar Spine Complete  Result Date: 12/07/2023 CLINICAL DATA:  Dog bite EXAM: LUMBAR SPINE - COMPLETE 4+ VIEW COMPARISON:  None Available. FINDINGS: Five lumbar-type vertebral bodies. Normal lumbar lordosis. No evidence of fracture or dislocation. Vertebral body heights are maintained. Visualized bony pelvis appears intact. IMPRESSION: Negative. Electronically Signed   By: Charline Bills M.D.   On: 12/07/2023 22:40   DG Hand Complete Left  Result Date: 12/07/2023 CLINICAL  DATA:  Dog bite EXAM: LEFT HAND - COMPLETE 3+ VIEW COMPARISON:  None Available. FINDINGS: No fracture or dislocation is seen. The joint spaces are preserved. Visualized soft tissues are within normal limits. IMPRESSION: Negative. Electronically Signed   By: Charline Bills M.D.   On: 12/07/2023 22:39   DG Forearm Right  Result Date:  12/07/2023 CLINICAL DATA:  Dog bite EXAM: RIGHT FOREARM - 2 VIEW COMPARISON:  None Available. FINDINGS: No fracture or dislocation is seen. The joint spaces are preserved. Visualized soft tissues are within normal limits. IMPRESSION: Negative. Electronically Signed   By: Charline Bills M.D.   On: 12/07/2023 22:39   DG Hand Complete Right  Result Date: 12/07/2023 CLINICAL DATA:  Dog bite EXAM: RIGHT HAND - COMPLETE 3+ VIEW COMPARISON:  None Available. FINDINGS: No fracture or dislocation is seen. The joint spaces are preserved. Visualized soft tissues are within normal limits. No radiopaque foreign body is seen. IMPRESSION: Negative. Electronically Signed   By: Charline Bills M.D.   On: 12/07/2023 22:39    ____________________________________________   PROCEDURES  Procedure(s) performed:   Procedures  None  ____________________________________________   INITIAL IMPRESSION / ASSESSMENT AND PLAN / ED COURSE  Pertinent labs & imaging results that were available during my care of the patient were reviewed by me and considered in my medical decision making (see chart for details).   This patient is Presenting for Evaluation of dog bite, which does require a range of treatment options, and is a complaint that involves a moderate risk of morbidity and mortality.  The Differential Diagnoses include laceration, abrasion, retained FB, rabies exposure, etc.  Critical Interventions-    Medications  rabies immune globulin (HYPERRAB/KEDRAB) injection 1,650 Units (1,650 Units Intramuscular Given 12/07/23 2145)  rabies vaccine (RABAVERT) injection 1 mL (1 mL Intramuscular Given 12/07/23 2141)  amoxicillin-clavulanate (AUGMENTIN) 875-125 MG per tablet 1 tablet (1 tablet Oral Given 12/07/23 2141)    Reassessment after intervention: pain well controlled.   Radiologic Tests Ordered, included XR imaging. I independently interpreted the images and agree with radiology interpretation.    Medical Decision Making: Summary:  Patient presents emergency department with dog bite to the extremities.  Superficial abrasions mostly noted with very small puncture to the right thumb.  X-rays obtained to evaluate for bony injury or foreign body.  None were found.  The vaccination status of the dogs are unknown.  Animal control is involved.  Advised patient move forward with rabies immunoglobulin and vaccine.  Will continue the series at the [facility] urgent care.  Gave dates and location for those shots as well. Will start Augmentin.   Patient's presentation is most consistent with acute, uncomplicated illness.   Disposition: discharge  ____________________________________________  FINAL CLINICAL IMPRESSION(S) / ED DIAGNOSES  Final diagnoses:  Dog bite, initial encounter  Fall, initial encounter  Contusion of right knee, initial encounter     NEW OUTPATIENT MEDICATIONS STARTED DURING THIS VISIT:  Discharge Medication List as of 12/07/2023 10:52 PM     START taking these medications   Details  amoxicillin-clavulanate (AUGMENTIN) 875-125 MG tablet Take 1 tablet by mouth every 12 (twelve) hours., Starting Tue 12/07/2023, Normal        Note:  This document was prepared using Dragon voice recognition software and may include unintentional dictation errors.  Alona Bene, MD, [facility] Emergency Medicine    Riannah Stagner, Arlyss Repress, MD 12/07/23 [phone]

## 2023-12-07 NOTE — Discharge Instructions (Signed)
                                  RABIES VACCINE FOLLOW UP  Patient's Name: Wanda Guzman                     Original Order Date:12/07/2023  Medical Record Number: [id number]  ED Physician: Maia Plan, MD Primary Diagnosis: Rabies Exposure       PCP: Pcp, No  Patient Phone Number: (home) [phone] (home)    (cell)  Telephone Information:  Mobile [phone]    (work) There is no work phone number on file. Species of Animal:     You have been seen in the Emergency Department for a possible rabies exposure. It's very important you return for the additional vaccine doses.  Please call the clinic listed below for hours of operation.   Clinic that will administer your rabies vaccines:  [facility] [address] [place], [place] [address]   DAY 0:  12/07/2023      DAY 3:  12/10/2023       DAY 7:  12/14/2023     DAY 14:  12/21/2023         The 5th vaccine injection is considered for immune compromised patients only.  DAY 28:  01/04/2024

## 2024-05-16 DIAGNOSIS — N760 Acute vaginitis: Secondary | ICD-10-CM | POA: Diagnosis not present

## 2024-05-16 DIAGNOSIS — Z114 Encounter for screening for human immunodeficiency virus [HIV]: Secondary | ICD-10-CM | POA: Diagnosis not present

## 2024-05-16 DIAGNOSIS — L739 Follicular disorder, unspecified: Secondary | ICD-10-CM | POA: Diagnosis not present

## 2024-05-16 DIAGNOSIS — Z113 Encounter for screening for infections with a predominantly sexual mode of transmission: Secondary | ICD-10-CM | POA: Diagnosis not present

## 2024-05-18 DIAGNOSIS — N766 Ulceration of vulva: Secondary | ICD-10-CM | POA: Diagnosis not present

## 2024-05-18 DIAGNOSIS — Z113 Encounter for screening for infections with a predominantly sexual mode of transmission: Secondary | ICD-10-CM | POA: Diagnosis not present

## 2024-06-22 DIAGNOSIS — F349 Persistent mood [affective] disorder, unspecified: Secondary | ICD-10-CM | POA: Diagnosis not present

## 2024-06-27 DIAGNOSIS — F32A Depression, unspecified: Secondary | ICD-10-CM | POA: Diagnosis not present

## 2024-09-28 ENCOUNTER — Encounter: Payer: Self-pay | Admitting: Nurse Practitioner

## 2024-09-28 ENCOUNTER — Ambulatory Visit: Admitting: Nurse Practitioner

## 2024-09-28 VITALS — BP 128/76 | HR 76 | Ht 63.0 in | Wt 181.2 lb

## 2024-09-28 DIAGNOSIS — F330 Major depressive disorder, recurrent, mild: Secondary | ICD-10-CM | POA: Diagnosis not present

## 2024-09-28 DIAGNOSIS — A6004 Herpesviral vulvovaginitis: Secondary | ICD-10-CM | POA: Diagnosis not present

## 2024-09-28 DIAGNOSIS — L739 Follicular disorder, unspecified: Secondary | ICD-10-CM | POA: Diagnosis not present

## 2024-09-28 LAB — POCT URINE PREGNANCY: Preg Test, Ur: NEGATIVE

## 2024-09-28 MED ORDER — VALACYCLOVIR HCL 1 G PO TABS: 1000.0000 mg | ORAL_TABLET | Freq: Every day | ORAL | 1 refills | Status: DC

## 2024-09-28 MED ORDER — VALACYCLOVIR HCL 500 MG PO TABS: 500.0000 mg | ORAL_TABLET | Freq: Two times a day (BID) | ORAL | 1 refills | Status: AC

## 2024-09-28 NOTE — Progress Notes (Signed)
 Established Patient Visit  Patient: Wanda Guzman   DOB: 1996-10-08   28 y.o. Female  MRN: [id number] Visit Date: 09/28/2024  Subjective:    Chief Complaint  Patient presents with   Medical concerns     New Dx of Herpes in summer wants to discuss and mental health concerns  Vaccines needed for school    HPI Folliculitis Waxing and waning Secondary to hair removal-waxing every 1-40months Advised to use warm compress and stop waxing. Use hair removal cream or clippers/scissors. 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 Valtrex prescription for episodic outbreaks, She reports approximately 5outbreaks in last 10months. No current outbreak. She completed STI screen 3months ago-negative patient. Sexually active with partner, no condom or hormonal contraception  Urine pregnancy-negative We discussed suppression vs episodic therapy with use of valtrex. She agreed to suppressive therapy 500mg  BID x 3months, then 500mg  daily x 3months, the episodic therapy. 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 No SI/HI/hallucination No hx of IVC Previous counseling session x 2months, states it was not helpful  She agreed to another referral F/up in 57month     09/28/2024   10:05 AM  Depression screen PHQ 2/9  Decreased Interest 2  Down, Depressed, Hopeless 0  PHQ - 2 Score 2  Altered sleeping 0  Tired, decreased energy 1  Change in appetite 1  Feeling bad or failure about yourself  1  Trouble concentrating 0  Moving slowly or fidgety/restless 0  Suicidal thoughts 0  PHQ-9 Score 5  Difficult doing work/chores Not difficult at all       09/28/2024   10:06 AM  GAD 7 : Generalized Anxiety Score  Nervous, Anxious, on Edge 0  Control/stop worrying 3  Worry  too much - different things 1  Trouble relaxing 0  Restless 0  Easily annoyed or irritable 1  Afraid - awful might happen 0  Total GAD 7 Score 5  Anxiety Difficulty Not difficult at all   Reviewed medical, surgical, and social history today  Medications: Outpatient Medications Prior to Visit  Medication Sig   [DISCONTINUED] valACYclovir (VALTREX) 1000 MG tablet Take 1,000 mg by mouth. Take by mouth for onset outbreak   [DISCONTINUED] acetaminophen  (TYLENOL ) 500 MG tablet Take 1,000 mg by mouth every 6 (six) hours as needed for mild pain or moderate pain.   [DISCONTINUED] amoxicillin -clavulanate (AUGMENTIN ) 875-125 MG tablet Take 1 tablet by mouth every 12 (twelve) hours.   [DISCONTINUED] dicyclomine  (BENTYL ) 20 MG tablet Take 1 tablet (20 mg total) by mouth 2 (two) times daily as needed for spasms.   [DISCONTINUED] sucralfate  (CARAFATE ) 1 g tablet Take 1 tablet (1 g total) by mouth 4 (four) times daily -  with meals and at bedtime for 5 days.   No facility-administered medications prior to visit.   Reviewed past medical and social history.   ROS per HPI above      Objective:  BP 128/76 (BP Location: Left Arm, Patient Position: Sitting, Cuff Size: Large)   Pulse 76   Ht 5' 3 (1.6 m)   Wt 181 lb 3.2 oz (82.2 kg)   LMP 09/21/2024 (Exact Date)   SpO2 99%   BMI 32.10 kg/m      Physical  Exam Vitals and nursing note reviewed. Exam conducted with a chaperone present.  Cardiovascular:     Rate and Rhythm: Normal rate.     Pulses: Normal pulses.  Pulmonary:     Effort: Pulmonary effort is normal.  Genitourinary:    General: Normal vulva.     Labia:        Right: No rash, tenderness or lesion.        Left: No rash, tenderness or lesion.   Neurological:     Mental Status: She is alert and oriented to person, place, and time.  Psychiatric:        Mood and Affect: Mood normal.        Behavior: Behavior normal.        Thought Content: Thought content normal.     Results  for orders placed or performed in visit on 09/28/24  POCT urine pregnancy  Result Value Ref Range   Preg Test, Ur Negative Negative      Assessment & Plan:    Problem List Items Addressed This Visit     Folliculitis   Waxing and waning Secondary to hair removal-waxing every 1-77months Advised to use warm compress and stop waxing. Use hair removal cream or clippers/scissors. No need for oral abx at this time      Herpes simplex vulvovaginitis - Primary   1st outbreak 10months ago on her vulva, diagnosis confirmed 4months by [facility] health department. Valtrex prescription for episodic outbreaks, She reports approximately 5outbreaks in last 10months. No current outbreak. She completed STI screen 3months ago-negative patient. Sexually active with partner, no condom or hormonal contraception  Urine pregnancy-negative We discussed suppression vs episodic therapy with use of valtrex. She agreed to suppressive therapy 500mg  BID x 3months, then 500mg  daily x 3months, the episodic therapy. F/up in 6months      Relevant Medications   valACYclovir (VALTREX) 500 MG tablet   Other Relevant Orders   POCT urine pregnancy (Completed)   Mild episode of recurrent major depressive disorder   Chronic, waxing and waning, worse with recent diagnosis of HSV. No SI/HI/hallucination No hx of IVC Previous counseling session x 2months, states it was not helpful  She agreed to another referral F/up in 37month      Relevant Orders   Ambulatory referral to Psychology   Return in about 4 weeks (around 10/26/2024) for CPE (fasting).     Roselie Mood, NP

## 2024-09-28 NOTE — Assessment & Plan Note (Signed)
 Chronic, waxing and waning, worse with recent diagnosis of HSV. No SI/HI/hallucination No hx of IVC Previous counseling session x 2months, states it was not helpful  She agreed to another referral F/up in 75month

## 2024-09-28 NOTE — Assessment & Plan Note (Signed)
 Waxing and waning Secondary to hair removal-waxing every 1-2months Advised to use warm compress and stop waxing. Use hair removal cream or clippers/scissors. No need for oral abx at this time

## 2024-09-28 NOTE — Patient Instructions (Addendum)
 Thank you for choosing [facility]  If negative urine pregnancy, Start suppression therapy with valtrex 500mg  BIDx 3months, then 500mg  daily x 3months, then resume episodic treatment.  How to Have Safe Sex Having safe sex means taking steps before and during sex to reduce your risk of: Getting a sexually transmitted infection (STI). Giving your partner an STI. Unwanted or unplanned pregnancy. How to have safe sex Ways you can have safe sex  Limit your sex partners to only one partner who is only having sex with you. Avoid using alcohol and drugs before having sex. Alcohol and drugs can affect your judgment. Before having sex with a new partner: Talk to your partner about past partners, past STIs, and drug use. Get screened for STIs and discuss the results with your partner. Ask your partner to get screened too. Check your body regularly for sores, blisters, rashes, or unusual discharge. If you notice any of these things, call your health care provider. Avoid sexual contact if you have symptoms of an infection or you're being treated for an STI. While having sex, use a condom. Make sure to: Use a condom every time you have vaginal, oral, or anal sex. Both females and males should wear condoms during oral sex. Do not use a female condom and a female condom at the same time during vaginal sex. Using both types at the same time can cause condoms to break. Keep condoms in place from the beginning to the end of sexual activity. Use a latex condom, if possible. Latex condoms offer the best protection. Use only water-based lubricants with a condom. Using petroleum-based lubricants or oils will weaken the condom and increase the chance that it will break. Ways your health care provider can help you have safe sex  See your provider for regular screenings, exams, and tests for STIs. Talk with your provider about what kind of birth control is best for you. Get vaccinated against hepatitis B  and human papillomavirus (HPV). If you're at risk of getting human immunodeficiency virus (HIV), talk with your provider about taking a medicine to prevent HIV. You're at risk for HIV if you: Are a female who has sex with other males. Are sexually active with more than one partner. Take drugs by injection. Have a sex partner who has HIV. Have unprotected sex. Have sex with someone who has sex with both males and females. Follow these instructions at home: Take your medicines only as told. Call your provider if you think you might be pregnant. Call your provider if have any symptoms of an infection. Where to find more information Centers for Disease Control and Prevention (CDC): TonerPromos.no Office on Women's Health: TravelLesson.ca This information is not intended to replace advice given to you by your health care provider. Make sure you discuss any questions you have with your health care provider. Document Revised: 05/05/2023 Document Reviewed: 05/05/2023 Elsevier Patient Education  2024 [employer].

## 2024-09-28 NOTE — Telephone Encounter (Signed)
 Called patient and she is now scheduled for a nurse visit for 10/03/24 at 1:40 PM

## 2024-09-28 NOTE — Assessment & Plan Note (Addendum)
 1st outbreak 10months ago on her vulva, diagnosis confirmed 4months by [facility] health department. Valtrex prescription for episodic outbreaks, She reports approximately 5outbreaks in last 10months. No current outbreak. She completed STI screen 3months ago-negative patient. Sexually active with partner, no condom or hormonal contraception  Urine pregnancy-negative We discussed suppression vs episodic therapy with use of valtrex. She agreed to suppressive therapy 500mg  BID x 3months, then 500mg  daily x 3months, the episodic therapy. F/up in 6months

## 2024-10-03 ENCOUNTER — Ambulatory Visit (INDEPENDENT_AMBULATORY_CARE_PROVIDER_SITE_OTHER)

## 2024-10-03 DIAGNOSIS — Z111 Encounter for screening for respiratory tuberculosis: Secondary | ICD-10-CM | POA: Diagnosis not present

## 2024-10-03 DIAGNOSIS — Z23 Encounter for immunization: Secondary | ICD-10-CM

## 2024-10-03 NOTE — Progress Notes (Signed)
 Patient is in office today for a nurse visit for PPD skin test. PPD was placed in right forearm. Patient tolerated injection well and is scheduled to come back to the office on Thursday to have PPD read.  Patient is in office today for a nurse visit for Influenza Vaccine. Injection was given in the right deltoid by Laymon Gladis Sharps, CMA. Patient tolerated injection well.

## 2024-10-05 ENCOUNTER — Ambulatory Visit

## 2024-10-05 ENCOUNTER — Ambulatory Visit: Payer: Self-pay | Admitting: Nurse Practitioner

## 2024-10-05 DIAGNOSIS — R7611 Nonspecific reaction to tuberculin skin test without active tuberculosis: Secondary | ICD-10-CM

## 2024-10-05 NOTE — Progress Notes (Signed)
 PPD Reading Note PPD read and results entered in EpicCare. Result: 13 mm induration. Interpretation: positive If test not read within 48-72 hours of initial placement, patient advised to repeat in other arm 1-3 weeks after this test. Allergic reaction: no

## 2024-10-05 NOTE — Assessment & Plan Note (Signed)
 13mm induration on right forearm and surrounding erythema. Denies any known exposure or recent travel Asymptomatic Ordered CXR

## 2024-10-05 NOTE — Addendum Note (Signed)
 Addended by: KATHEEN GARDEN L on: 10/05/2024 03:02 PM   Modules accepted: Orders

## 2024-10-12 NOTE — Telephone Encounter (Signed)
 PPD form and chest x-ray printed and placed up front for patient to pick up.  [id number]

## 2024-11-03 ENCOUNTER — Encounter: Admitting: Nurse Practitioner

## 2024-12-03 ENCOUNTER — Telehealth

## 2024-12-03 ENCOUNTER — Telehealth: Admitting: Nurse Practitioner

## 2024-12-03 DIAGNOSIS — L739 Follicular disorder, unspecified: Secondary | ICD-10-CM

## 2024-12-03 NOTE — Patient Instructions (Signed)
  Wanda Guzman, thank you for joining Haze LELON Servant, NP for today's virtual visit.  While this provider is not your primary care provider (PCP), if your PCP is located in our provider database this encounter information will be shared with them immediately following your visit.   A [facility] MyChart account gives you access to today's visit and all your visits, tests, and labs performed at [facility]  click here if you don't have a [facility] MyChart account or go to [email or web address]  Consent: (Patient) Wanda Guzman provided verbal consent for this virtual visit at the beginning of the encounter.  Current Medications:  Current Outpatient Medications:    valACYclovir  (VALTREX ) 500 MG tablet, Take 1 tablet (500 mg total) by mouth 2 (two) times daily., Disp: 180 tablet, Rfl: 1   Medications ordered in this encounter:  No orders of the defined types were placed in this encounter.    *If you need refills on other medications prior to your next appointment, please contact your pharmacy*  Follow-Up: Call back or seek an in-person evaluation if the symptoms worsen or if the condition fails to improve as anticipated.  [facility] [phone]  Other Instructions Apply frequent warm compresses to the area of concern. Attempt to remove ingrown hairs  with disinfected tweezers if possible.    If you have been instructed to have an in-person evaluation today at a local Urgent Care facility, please use the link below. It will take you to a list of all of our available [facility], including address, phone number and hours of operation. Please do not delay care.  [facility]  If you or a family member do not have a primary care provider, use the link below to schedule a visit and establish care. When you choose a [facility] primary care physician or advanced practice provider, you gain a long-term partner in health. Find a  Primary Care Provider  Learn more about [facility]'s in-office and virtual care options: [facility] - Get Care Now

## 2024-12-03 NOTE — Progress Notes (Signed)
 Virtual Visit Consent   Wanda Guzman, you are scheduled for a virtual visit with a [facility] provider today. Just as with appointments in the office, your consent must be obtained to participate. Your consent will be active for this visit and any virtual visit you may have with one of our providers in the next 365 days. If you have a MyChart account, a copy of this consent can be sent to you electronically.  As this is a virtual visit, video technology does not allow for your provider to perform a traditional examination. This may limit your provider's ability to fully assess your condition. If your provider identifies any concerns that need to be evaluated in person or the need to arrange testing (such as labs, EKG, etc.), we will make arrangements to do so. Although advances in technology are sophisticated, we cannot ensure that it will always work on either your end or our end. If the connection with a video visit is poor, the visit may have to be switched to a telephone visit. With either a video or telephone visit, we are not always able to ensure that we have a secure connection.  By engaging in this virtual visit, you consent to the provision of healthcare and authorize for your insurance to be billed (if applicable) for the services provided during this visit. Depending on your insurance coverage, you may receive a charge related to this service.  I need to obtain your verbal consent now. Are you willing to proceed with your visit today? Wanda Guzman has provided verbal consent on 12/03/2024 for a virtual visit (video or telephone). Haze LELON Servant, NP  Date: 12/03/2024 2:09 PM   Virtual Visit via Video Note   I, Haze LELON Servant, connected with  Wanda Guzman  ([id number], 1996-09-06) on 12/03/24 at  2:00 PM EST by a video-enabled telemedicine application and verified that I am speaking with the correct person using two identifiers.  Location: Patient: Virtual Visit Location  Patient: Home Provider: Virtual Visit Location Provider: Home Office   I discussed the limitations of evaluation and management by telemedicine and the availability of in person appointments. The patient expressed understanding and agreed to proceed.    History of Present Illness: Wanda Guzman is a 28 y.o. who identifies as a female who was assigned female at birth, and is being seen today for painful hair bumps.  Ms. Ware has been experiencing frequent episodes of painful folliculitis in the vaginal and groin area. She stopped shaving and waxing her pubic hairs but recently over the past few weeks started using VEET for hair removal.    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  Resting comfortably at home.  Head is normocephalic, atraumatic.  No labored breathing. Speech is clear and coherent with logical content.  Patient is alert and oriented at baseline.    Assessment and Plan: 1. Folliculitis (Primary)  Apply frequent warm compresses to the area of concern. Attempt to remove ingrown hairs  with disinfected tweezers if possible.  Follow Up Instructions: I discussed the assessment and treatment plan with the patient. The patient was provided an opportunity to ask questions and all were answered. The patient agreed with the plan and demonstrated an understanding of the instructions.  A copy of instructions were sent to the patient via MyChart unless otherwise noted below.    The  patient was advised to call back or seek an in-person evaluation if the symptoms worsen or if the condition fails to improve as anticipated.    Normajean Nash W Keshona Kartes, NP

## 2024-12-04 ENCOUNTER — Encounter

## 2024-12-04 ENCOUNTER — Encounter: Payer: Self-pay | Admitting: Nurse Practitioner

## 2024-12-05 ENCOUNTER — Telehealth

## 2024-12-05 DIAGNOSIS — L02224 Furuncle of groin: Secondary | ICD-10-CM | POA: Diagnosis not present

## 2024-12-05 DIAGNOSIS — L739 Follicular disorder, unspecified: Secondary | ICD-10-CM | POA: Diagnosis not present

## 2024-12-05 MED ORDER — CEPHALEXIN 500 MG PO CAPS: 500.0000 mg | ORAL_CAPSULE | Freq: Four times a day (QID) | ORAL | 0 refills | Status: DC

## 2024-12-05 NOTE — Patient Instructions (Signed)
  Wanda Guzman, thank you for joining Elsie Velma Lunger, PA-C for today's virtual visit.  While this provider is not your primary care provider (PCP), if your PCP is located in our provider database this encounter information will be shared with them immediately following your visit.   A [facility] MyChart account gives you access to today's visit and all your visits, tests, and labs performed at [facility]  click here if you don't have a [facility] MyChart account or go to [email or web address]  Consent: (Patient) Wanda Guzman provided verbal consent for this virtual visit at the beginning of the encounter.  Current Medications:  Current Outpatient Medications:    cephALEXin  (KEFLEX ) 500 MG capsule, Take 1 capsule (500 mg total) by mouth 4 (four) times daily., Disp: 20 capsule, Rfl: 0   valACYclovir  (VALTREX ) 500 MG tablet, Take 1 tablet (500 mg total) by mouth 2 (two) times daily., Disp: 180 tablet, Rfl: 1   Medications ordered in this encounter:  Meds ordered this encounter  Medications   cephALEXin  (KEFLEX ) 500 MG capsule    Sig: Take 1 capsule (500 mg total) by mouth 4 (four) times daily.    Dispense:  20 capsule    Refill:  0    Supervising Provider:   BLAISE ALEENE KIDD [[id number]]     *If you need refills on other medications prior to your next appointment, please contact your pharmacy*  Follow-Up: Call back or seek an in-person evaluation if the symptoms worsen or if the condition fails to improve as anticipated.  [facility] [phone]  Other Instructions Please keep the skin clean and dry. Wear loosefitting clothing. Continue warm compresses when possible. Take the antibiotic as directed. You can take your typical OTC pain relievers as well. It may be beneficial to use a Hibiclens solution once or twice weekly to the inner thighs to help reduce bacterial load, and hopefully lower risk of subsequent infections. Since you are  prone to these, with any type of hair removal in that area, you are going to have an increased risk of developing these boils. If you note any non-resolving, new, or worsening symptoms despite treatment, please seek an in-person evaluation ASAP.    If you have been instructed to have an in-person evaluation today at a local Urgent Care facility, please use the link below. It will take you to a list of all of our available [facility], including address, phone number and hours of operation. Please do not delay care.  [facility]  If you or a family member do not have a primary care provider, use the link below to schedule a visit and establish care. When you choose a [facility] primary care physician or advanced practice provider, you gain a long-term partner in health. Find a Primary Care Provider  Learn more about [facility]'s in-office and virtual care options: [facility] - Get Care Now

## 2024-12-05 NOTE — Progress Notes (Signed)
 Virtual Visit Consent   Wanda Guzman, you are scheduled for a virtual visit with a [facility] provider today. Just as with appointments in the office, your consent must be obtained to participate. Your consent will be active for this visit and any virtual visit you may have with one of our providers in the next 365 days. If you have a MyChart account, a copy of this consent can be sent to you electronically.  As this is a virtual visit, video technology does not allow for your provider to perform a traditional examination. This may limit your provider's ability to fully assess your condition. If your provider identifies any concerns that need to be evaluated in person or the need to arrange testing (such as labs, EKG, etc.), we will make arrangements to do so. Although advances in technology are sophisticated, we cannot ensure that it will always work on either your end or our end. If the connection with a video visit is poor, the visit may have to be switched to a telephone visit. With either a video or telephone visit, we are not always able to ensure that we have a secure connection.  By engaging in this virtual visit, you consent to the provision of healthcare and authorize for your insurance to be billed (if applicable) for the services provided during this visit. Depending on your insurance coverage, you may receive a charge related to this service.  I need to obtain your verbal consent now. Are you willing to proceed with your visit today? Wanda Guzman has provided verbal consent on 12/05/2024 for a virtual visit (video or telephone). Wanda Guzman, [place]  Date: 12/05/2024 8:56 AM   Virtual Visit via Video Note   I, Wanda Guzman, connected with  Wanda Guzman  ([id number], 1996-07-22) on 12/05/24 at  8:45 AM EST by a video-enabled telemedicine application and verified that I am speaking with the correct person using two identifiers.  Location: Patient: Virtual Visit  Location Patient: Home Provider: Virtual Visit Location Provider: Home Office   I discussed the limitations of evaluation and management by telemedicine and the availability of in person appointments. The patient expressed understanding and agreed to proceed.    History of Present Illness: Wanda Guzman is a 28 y.o. who identifies as a female who was assigned female at birth, and is being seen today for increased welling, pain and tenderness from a boil on the inside of her right thigh, despite following conservative measures recommended by previous telemedicine provider.  Notes history of recurring folliculitis and boils in her groin area that thankfully has become less frequent since she stopped shaving or waxing.  Does note use of Veet about 2 weeks ago for hair removal in the area.  This is much as possible.  Now noting a couple of other areas of inflammation and mild swelling in the crease of her right thigh.  Denies fever or chills.  Notes hard to walk due to friction in the area.   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  Resting comfortably  at home.  Head is normocephalic, atraumatic.  No labored breathing.  Speech is clear and coherent with logical content.  Patient is alert and oriented at baseline.   Assessment and Plan: 1. Boil of inguinal region (Primary)  2. Folliculitis  Supportive measures and OTC medications reviewed.  Will start Keflex  500 mg 4 times daily for 5  days.  She is okay to continue warm compresses.  Home care instructions and suggestions to do a few times a week to help reduce risk of subsequent issue.  She is aware that if symptoms not quickly improving or fully resolving, she needs an in person evaluation.  Follow Up Instructions: I discussed the assessment and treatment plan with the patient. The patient was provided an opportunity to ask questions and all were answered. The patient agreed with the plan and demonstrated an understanding of the instructions.  A copy of instructions were sent to the patient via MyChart unless otherwise noted below.   The patient was advised to call back or seek an in-person evaluation if the symptoms worsen or if the condition fails to improve as anticipated.    Wanda Velma Lunger, PA-C

## 2024-12-07 ENCOUNTER — Ambulatory Visit
Admission: RE | Admit: 2024-12-07 | Discharge: 2024-12-07 | Disposition: A | Discharge status: Home / Self Care | Source: Ambulatory Visit | Attending: Nurse Practitioner

## 2024-12-07 ENCOUNTER — Telehealth: Payer: Self-pay | Admitting: Nurse Practitioner

## 2024-12-07 VITALS — BP 108/78 | HR 77 | Temp 98.5°F | Resp 20

## 2024-12-07 DIAGNOSIS — N764 Abscess of vulva: Secondary | ICD-10-CM | POA: Diagnosis not present

## 2024-12-07 DIAGNOSIS — L739 Follicular disorder, unspecified: Secondary | ICD-10-CM | POA: Diagnosis not present

## 2024-12-07 DIAGNOSIS — L0291 Cutaneous abscess, unspecified: Secondary | ICD-10-CM | POA: Diagnosis not present

## 2024-12-07 MED ORDER — KETOROLAC TROMETHAMINE 60 MG/2ML IM SOLN
60.0000 mg | Freq: Once | INTRAMUSCULAR | Status: AC
  Administered 2024-12-07: 60 mg via INTRAMUSCULAR

## 2024-12-07 MED ORDER — KETOROLAC TROMETHAMINE 10 MG PO TABS: 10.0000 mg | ORAL_TABLET | Freq: Four times a day (QID) | ORAL | 0 refills | Status: AC | PRN

## 2024-12-07 MED ORDER — SULFAMETHOXAZOLE-TRIMETHOPRIM 800-160 MG PO TABS: 1.0000 | ORAL_TABLET | Freq: Two times a day (BID) | ORAL | 0 refills | Status: DC

## 2024-12-07 MED ORDER — OXYCODONE HCL 5 MG PO TABS: 5.0000 mg | ORAL_TABLET | ORAL | 0 refills | Status: AC | PRN

## 2024-12-07 MED ORDER — CEFTRIAXONE SODIUM 1 G IJ SOLR
1.0000 g | Freq: Once | INTRAMUSCULAR | Status: AC
  Administered 2024-12-07: 1 g via INTRAMUSCULAR

## 2024-12-07 NOTE — ED Triage Notes (Signed)
 Pt states she has 3 abscess in her right groin area for the past 4 days.  States one of them is draining and one of them popped but is not draining anymore.

## 2024-12-07 NOTE — ED Provider Notes (Signed)
 [facility]    CSN: [id number] Arrival date & time: 12/07/24  0813      History   Chief Complaint Chief Complaint  Patient presents with   Follow-up    Follow up with abscess that has busted and is still draining 8 hours later. It is located on inner right thigh, crease and right side of private area - Entered by patient   Abscess    HPI Wanda Guzman is a 28 y.o. female.   Discussed the use of AI scribe software for clinical note transcription with the patient, who gave verbal consent to proceed.   The patient presents with recurrent abscesses in the groin region, which she associates with folliculitis from shaving after discontinuing routine waxing. She reports multiple episodes since October, with several visits for this condition. Her most recent encounter was a video visit on December 9th, during which she was prescribed a 5-day course of Keflex ; she states she still has a couple of days remaining.  She reports that the abscesses have progressed and are now significantly affecting her daily functioning. She states, I can't walk correctly, I can't sit down correctly, I can't use the bathroom, I can't do nothing, due to severe pain. She expresses a desire to have the abscesses lanced. Current lesions are noted within the groin area, including the labia and inner right thigh, with multiple abscesses present.  The patient denies fever and confirms she is not diabetic. She reports that none of the abscesses have previously been lanced or evaluated in person prior to today.  The following sections of the patient's history were reviewed and updated as appropriate: allergies, current medications, past family history, past medical history, past social history, past surgical history, and problem list.     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      Home Medications    Prior to Admission medications  Medication Sig Start Date End Date Taking? Authorizing Provider  ketorolac  (TORADOL ) 10 MG tablet Take 1 tablet (10 mg total) by mouth every 6 (six) hours as needed for moderate pain (pain score 4-6). Take with food to avoid stomach upset. Do not take any additional NSAIDs while on this. You may take tylenol  in addition to this if needed for extra pain relief. 12/07/24  Yes Iola Lukes, FNP  oxyCODONE (ROXICODONE) 5 MG immediate release tablet Take 1 tablet (5 mg total) by mouth every 4 (four) hours as needed for severe pain (pain score 7-10). 12/07/24  Yes Iola Lukes, FNP  sulfamethoxazole -trimethoprim  (BACTRIM  DS) 800-160 MG tablet Take 1 tablet by mouth 2 (two) times daily for 7 days. 12/07/24 12/14/24 Yes Iola Lukes, FNP  cephALEXin  (KEFLEX ) 500 MG capsule Take 1 capsule (500 mg total) by mouth 4 (four) times daily. 12/05/24   Gladis Elsie BROCKS, PA-C  valACYclovir  (VALTREX ) 500 MG tablet Take 1 tablet (500 mg total) by mouth 2 (two) times daily. 09/28/24   Nche, Roselie Rockford, NP  topiramate ER (QUDEXY XR) CS24  sprinkle capsule Take 100 mg by mouth daily.  09/20/19  [provider]    Family History Family History  Problem Relation Age of Onset   Miscarriages / Stillbirths Mother    Hypertension Father    Diabetes Father    Diabetes Maternal Grandmother    Diabetes Paternal Grandmother    Hypertension Other    Diabetes Mellitus I Other     Social History Social History[1]   Allergies   Patient has no known allergies.   Review of Systems Review of Systems  Constitutional:   Negative for fever.  Gastrointestinal:  Negative for nausea and vomiting.  Skin:  Positive for wound.  All other systems reviewed and are negative.    Physical Exam Triage Vital Signs ED Triage Vitals  Encounter Vitals Group     BP 12/07/24 0830 108/78     Girls Systolic BP Percentile --      Girls Diastolic BP Percentile --      Boys Systolic BP Percentile --      Boys Diastolic BP Percentile --      Pulse Rate 12/07/24 0830 77     Resp 12/07/24 0830 20     Temp 12/07/24 0830 98.5 F (36.9 C)     Temp Source 12/07/24 0830 Oral     SpO2 12/07/24 0830 99 %     Weight --      Height --      Head Circumference --      Peak Flow --      Pain Score 12/07/24 0834 7     Pain Loc --      Pain Education --      Exclude from Growth Chart --    No data found.  Updated Vital Signs BP 108/78 (BP Location: Right Arm)   Pulse 77   Temp 98.5 F (36.9 C) (Oral)   Resp 20   LMP 11/17/2024 (Exact Date)   SpO2 99%   Visual Acuity Right Eye Distance:   Left Eye Distance:   Bilateral Distance:    Right Eye Near:   Left Eye Near:    Bilateral Near:     Physical Exam Vitals reviewed. Exam conducted with a chaperone present (M. Withrow, CMA).  Constitutional:      General: She is not in acute distress.    Appearance: Normal appearance. She is not ill-appearing, toxic-appearing or diaphoretic.  HENT:     Head: Normocephalic.     Mouth/Throat:     Mouth: Mucous membranes are moist.  Cardiovascular:     Rate and Rhythm: Normal rate and regular rhythm.  Pulmonary:     Effort: Pulmonary effort is normal.     Breath sounds: Normal breath sounds.  Abdominal:     Palpations: Abdomen is soft.     Tenderness: There is no right CVA tenderness or left CVA tenderness.  Genitourinary:    Labia:        Right: Tenderness and lesion present.       Comments: Multiple areas of abscess formation involving the right labia majora, right labial-thigh fold, and inner right thigh. To the right  labia majora and right labial-thigh fold, there are localized areas of swelling with notable fluctuance, without associated induration, erythema, or active drainage. The inner right thigh demonstrates a large abscess with the majority of the area firm to palpation and without induration or erythema; a small region of fluctuance is appreciated medially near the labial-thigh fold. No spontaneous drainage is  present from any site. No streaking. All affected areas exhibit significant tenderness to palpation. Musculoskeletal:        General: Normal range of motion.     Cervical back: Normal, normal range of motion and neck supple.     Thoracic back: Normal.     Lumbar back: Tenderness present. No swelling, deformity, lacerations or spasms. Normal range of motion. Negative right straight leg raise test and negative left straight leg raise test.  Skin:    General: Skin is warm and dry.     Findings: Abscess present.  Neurological:     General: No focal deficit present.     Mental Status: She is alert and oriented to person, place, and time.     Cranial Nerves: Cranial nerves 2-12 are intact.     Sensory: Sensation is intact.     Motor: Motor function is intact. No weakness.     Coordination: Coordination is intact.     Gait: Gait is intact.  Psychiatric:        Mood and Affect: Mood normal.        Speech: Speech normal.        Behavior: Behavior normal. Behavior is cooperative.      UC Treatments / Results  Labs (all labs ordered are listed, but only abnormal results are displayed) Labs Reviewed  AEROBIC CULTURE W GRAM STAIN (SUPERFICIAL SPECIMEN)  CBC WITH DIFFERENTIAL/PLATELET    EKG   Radiology No results found.  Procedures Incision and Drainage  Date/Time: 12/07/2024 10:16 AM  Performed by: Iola Lukes, FNP Authorized by: Iola Lukes, FNP   Consent:    Consent obtained:  Verbal   Consent given by:  Patient   Risks, benefits, and alternatives were discussed:  yes     Risks discussed:  Bleeding, incomplete drainage, pain and infection Universal protocol:    Patient identity confirmed:  Verbally with patient and arm band Location:    Type:  Abscess   Location: see below. Pre-procedure details:    Skin preparation:  Povidone-iodine Sedation:    Sedation type:  None Anesthesia:    Anesthesia method:  Local infiltration   Local anesthetic:  Lidocaine  1% w/o epi Procedure type:    Complexity:  Complex Procedure details:    Incision types:  Single straight   Wound management:  Probed and deloculated, irrigated with saline and extensive cleaning   Drainage:  Bloody, serosanguinous and purulent   Drainage amount:  Copious   Wound treatment:  Drain placed   Packing materials:  1/4 in iodoform gauze Post-procedure details:    Procedure completion:  Procedure terminated electively by provider Comments:     A complex incision and drainage procedure was initiated for abscesses of the right labia majora, right labial-thigh fold, and right inner thigh. During infiltration of local anesthesia, it was observed that injection of lidocaine  into the right labial-thigh fold caused visible swelling within the right inner thigh abscess, followed by spontaneous drainage from the fluctuant portion of the thigh lesion. This indicated that the suspected three separate abscesses were likely connected, representing one large multiloculated abscess. Four incision sites were made: two along the right labia majora, one at the right labial-thigh fold, and one along the right inner thigh. All incision sites were copiously irrigated with normal saline and then packed with 1/4-inch iodoform gauze. Despite administering over 15 mL of 1% lidocaine  without epinephrine  into the right thigh abscess, adequate anesthesia could not be achieved. Due to the patient's significant discomfort, manual  expression of the thigh abscess was not performed, and packing was placed without applying  pressure. Given the extent and severity of the abscess cavity identified during the procedure and the patient's poor tolerance despite anesthetic measures, the procedure was terminated electively by me prior to full evacuation. The area was dressed with nonadherent gauze and secured. The patient tolerated the procedure poorly due to discomfort but experienced no immediate complications.  (including critical care time)  Medications Ordered in UC Medications  ketorolac  (TORADOL ) injection 60 mg (60 mg Intramuscular Given 12/07/24 0849)  cefTRIAXone  (ROCEPHIN ) injection 1 g (1 g Intramuscular Given 12/07/24 1010)    Initial Impression / Assessment and Plan / UC Course  I have reviewed the triage vital signs and the nursing notes.  Pertinent labs & imaging results that were available during my care of the patient were reviewed by me and considered in my medical decision making (see chart for details).     The patient presents with recurrent groin abscesses consistent with folliculitis-related infection and now demonstrating significant progression with impaired mobility and activity due to pain. Examination identified multiple interconnected abscesses involving the right labia majora, right labial-thigh fold, and inner right thigh, with fluctuance noted in several areas and evidence during anesthesia infiltration that these lesions formed one large multiloculated abscess. A complex I&D was attempted, and four incisions were made with saline irrigation and iodoform packing; however, despite more than 15 mL of lidocaine , adequate anesthesia could not be achieved, and full evacuation was not possible due to patient discomfort. Current management includes IM Toradol  for pain control, IM Rocephin  for broader antimicrobial coverage, collection of a wound culture, and continuation of her existing cephalexin  course pending culture-directed adjustments. Will add bactrim  to cover both MSSA and MRSA. Supportive  measures such as warm compresses and low-heat application were recommended to facilitate drainage. Toradol  was prescribed for moderate pain, with strict NSAID precautions, and a short course of oxycodone provided for severe pain. Given the extent of the interconnected abscess cavity and inability to achieve complete drainage in clinic, urgent surgical evaluation is warranted, and [facility] was contacted to arrange follow-up for the next day as the patient may require operative management under general anesthesia. The patient is afebrile, nontoxic, and hemodynamically stable with no signs of sepsis; a baseline CBC was obtained. She was instructed to monitor for worsening pain, fever, spreading redness, or systemic symptoms and to seek emergency care if these occur. Primary care and surgical follow-up were emphasized.  Today's evaluation has revealed no signs of a dangerous process. Discussed diagnosis with patient and/or guardian. Patient and/or guardian aware of their diagnosis, possible red flag symptoms to watch out for and need for close follow up. Patient and/or guardian understands verbal and written discharge instructions. Patient and/or guardian comfortable with plan and disposition.  Patient and/or guardian has a clear mental status at this time, good insight into illness (after discussion and teaching) and has clear judgment to make decisions regarding their care  Documentation was completed with the aid of voice recognition software. Transcription may contain typographical errors.  Final Clinical Impressions(s) / UC Diagnoses   Final diagnoses:  Folliculitis of perineum  Complicated abscess     Discharge Instructions      You were seen today for painful abscesses in the groin, including the labia and inner thigh. During the procedure, we found that these areas are connected and form one large abscess. We started an incision and drainage, and some drainage was achieved,  but  the abscess could not be fully cleaned out due to significant pain despite numbing medication. The areas were rinsed, packed, and dressed.  You received Toradol  for pain and an injection of Rocephin  (antibiotic). Continue taking your cephalexin  as prescribed, and take the second antibiotic that was added today as directed. A wound culture was collected, and we will notify you if your antibiotics need to be adjusted.  For symptom relief, you may apply a warm, damp washcloth and use a heating pad on low to help with drainage and comfort. Take Toradol  with food and avoid other NSAIDs such as ibuprofen  or naproxen . Tylenol  may be used if extra pain control is needed. A short course of oxycodone is available for severe pain only.  Because the abscess is large and could not be fully drained here, close follow-up with surgery is very important. [facility] will contact you today with an appointment for tomorrow.  Go to the emergency department immediately if you develop fever, chills, spreading redness, worsening swelling, rapidly increasing pain, foul-smelling drainage, trouble walking or urinating, or if you feel weak, lightheaded, or generally unwell.     ED Prescriptions     Medication Sig Dispense Auth. Provider   oxyCODONE (ROXICODONE) 5 MG immediate release tablet Take 1 tablet (5 mg total) by mouth every 4 (four) hours as needed for severe pain (pain score 7-10). 6 tablet Iola Lukes, FNP   ketorolac  (TORADOL ) 10 MG tablet Take 1 tablet (10 mg total) by mouth every 6 (six) hours as needed for moderate pain (pain score 4-6). Take with food to avoid stomach upset. Do not take any additional NSAIDs while on this. You may take tylenol  in addition to this if needed for extra pain relief. 20 tablet Cleophas Yoak, FNP   sulfamethoxazole -trimethoprim  (BACTRIM  DS) 800-160 MG tablet Take 1 tablet by mouth 2 (two) times daily for 7 days. 14 tablet Iola Lukes, FNP      I have  reviewed the PDMP during this encounter.      [1]  Social History Tobacco Use   Smoking status: Some Days    Types: Cigarettes   Smokeless tobacco: Never   Tobacco comments:    black and mild  Vaping Use   Vaping status: Every Day  Substance Use Topics   Alcohol use: Yes    Comment: occasionally   Drug use: Yes    Types: Marijuana     Iola Lukes, FNP 12/07/24 1354

## 2024-12-07 NOTE — Telephone Encounter (Signed)
 Patient contacted registration stating that [facility] called with an appointment for tomorrow but also mentioned she might need to be seen by GYN for the labial abscess and requested clarification. I contacted [facility] and confirmed that the patient is scheduled with Dr. Lyndel on 12/08/24 at 3:50 PM with an arrival time of 3:35 PM. I then called the patient and explained that the surgical team will evaluate the entire affected area and determine appropriate management, and if an additional specialist such as GYN is needed, they will coordinate that referral. The patient verbalized understanding, had all questions answered, and stated that she was able to obtain her medications and is currently resting with minimal pain.

## 2024-12-07 NOTE — Discharge Instructions (Addendum)
 You were seen today for painful abscesses in the groin, including the labia and inner thigh. During the procedure, we found that these areas are connected and form one large abscess. We started an incision and drainage, and some drainage was achieved, but the abscess could not be fully cleaned out due to significant pain despite numbing medication. The areas were rinsed, packed, and dressed.  You received Toradol  for pain and an injection of Rocephin  (antibiotic). Continue taking your cephalexin  as prescribed, and take the second antibiotic that was added today as directed. A wound culture was collected, and we will notify you if your antibiotics need to be adjusted.  For symptom relief, you may apply a warm, damp washcloth and use a heating pad on low to help with drainage and comfort. Take Toradol  with food and avoid other NSAIDs such as ibuprofen  or naproxen . Tylenol  may be used if extra pain control is needed. A short course of oxycodone is available for severe pain only.  Because the abscess is large and could not be fully drained here, close follow-up with surgery is very important. [facility] will contact you today with an appointment for tomorrow.  Go to the emergency department immediately if you develop fever, chills, spreading redness, worsening swelling, rapidly increasing pain, foul-smelling drainage, trouble walking or urinating, or if you feel weak, lightheaded, or generally unwell.

## 2024-12-08 ENCOUNTER — Inpatient Hospital Stay (HOSPITAL_COMMUNITY)
Admission: AD | Admit: 2024-12-08 | Discharge: 2024-12-10 | DRG: 758 | Disposition: A | Discharge status: Home / Self Care | Source: Ambulatory Visit | Attending: Surgery | Admitting: Surgery

## 2024-12-08 ENCOUNTER — Encounter (HOSPITAL_COMMUNITY): Payer: Self-pay

## 2024-12-08 DIAGNOSIS — L02415 Cutaneous abscess of right lower limb: Secondary | ICD-10-CM | POA: Diagnosis present

## 2024-12-08 DIAGNOSIS — G43709 Chronic migraine without aura, not intractable, without status migrainosus: Secondary | ICD-10-CM | POA: Diagnosis present

## 2024-12-08 DIAGNOSIS — Z833 Family history of diabetes mellitus: Secondary | ICD-10-CM

## 2024-12-08 DIAGNOSIS — Z8249 Family history of ischemic heart disease and other diseases of the circulatory system: Secondary | ICD-10-CM | POA: Diagnosis not present

## 2024-12-08 DIAGNOSIS — F17210 Nicotine dependence, cigarettes, uncomplicated: Secondary | ICD-10-CM | POA: Diagnosis present

## 2024-12-08 DIAGNOSIS — N764 Abscess of vulva: Secondary | ICD-10-CM | POA: Diagnosis not present

## 2024-12-08 LAB — CBC WITH DIFFERENTIAL/PLATELET
Basophils Absolute: 0 x10E3/uL (ref 0.0–0.2)
Basos: 0 %
EOS (ABSOLUTE): 0 x10E3/uL (ref 0.0–0.4)
Eos: 0 %
Hematocrit: 42 % (ref 34.0–46.6)
Hemoglobin: 13 g/dL (ref 11.1–15.9)
Immature Grans (Abs): 0.1 x10E3/uL (ref 0.0–0.1)
Immature Granulocytes: 1 %
Lymphocytes Absolute: 3.7 x10E3/uL — ABNORMAL HIGH (ref 0.7–3.1)
Lymphs: 33 %
MCH: 27.8 pg (ref 26.6–33.0)
MCHC: 31 g/dL — ABNORMAL LOW (ref 31.5–35.7)
MCV: 90 fL (ref 79–97)
Monocytes Absolute: 0.5 x10E3/uL (ref 0.1–0.9)
Monocytes: 5 %
Neutrophils Absolute: 7 x10E3/uL (ref 1.4–7.0)
Neutrophils: 61 %
Platelets: 278 x10E3/uL (ref 150–450)
RBC: 4.68 x10E6/uL (ref 3.77–5.28)
RDW: 13.5 % (ref 11.7–15.4)
WBC: 11.3 x10E3/uL — ABNORMAL HIGH (ref 3.4–10.8)

## 2024-12-08 LAB — LACTIC ACID, PLASMA: Lactic Acid, Venous: 0.7 mmol/L (ref 0.5–1.9)

## 2024-12-08 MED ORDER — ACETAMINOPHEN 325 MG PO TABS
650.0000 mg | ORAL_TABLET | Freq: Four times a day (QID) | ORAL | Status: DC
  Administered 2024-12-09 – 2024-12-10 (×5): 650 mg via ORAL
  Filled 2024-12-08 (×4): qty 2

## 2024-12-08 MED ORDER — HYDROCORTISONE 1 % EX CREA
1.0000 | TOPICAL_CREAM | Freq: Three times a day (TID) | CUTANEOUS | Status: DC | PRN
  Administered 2024-12-09: 1 via TOPICAL
  Filled 2024-12-08: qty 28

## 2024-12-08 MED ORDER — FLUCONAZOLE 150 MG PO TABS
150.0000 mg | ORAL_TABLET | Freq: Once | ORAL | Status: AC
  Administered 2024-12-08: 150 mg via ORAL
  Filled 2024-12-08: qty 1

## 2024-12-08 MED ORDER — LACTATED RINGERS IV SOLN: INTRAVENOUS | Status: AC

## 2024-12-08 MED ORDER — METHOCARBAMOL 1000 MG/10ML IJ SOLN
500.0000 mg | Freq: Four times a day (QID) | INTRAMUSCULAR | Status: DC | PRN
  Filled 2024-12-08: qty 10

## 2024-12-08 MED ORDER — HYDROMORPHONE HCL 1 MG/ML IJ SOLN: 1.0000 mg | INTRAMUSCULAR | Status: DC | PRN

## 2024-12-08 MED ORDER — ENOXAPARIN SODIUM 40 MG/0.4ML IJ SOSY
40.0000 mg | PREFILLED_SYRINGE | INTRAMUSCULAR | Status: DC
  Administered 2024-12-09: 40 mg via SUBCUTANEOUS
  Filled 2024-12-08: qty 0.4

## 2024-12-08 MED ORDER — DOCUSATE SODIUM 100 MG PO CAPS
100.0000 mg | ORAL_CAPSULE | Freq: Two times a day (BID) | ORAL | Status: DC
  Administered 2024-12-08 – 2024-12-10 (×4): 100 mg via ORAL
  Filled 2024-12-08 (×4): qty 1

## 2024-12-08 MED ORDER — SIMETHICONE 80 MG PO CHEW: 80.0000 mg | CHEWABLE_TABLET | Freq: Four times a day (QID) | ORAL | Status: DC | PRN

## 2024-12-08 MED ORDER — OXYCODONE HCL 5 MG PO TABS
5.0000 mg | ORAL_TABLET | ORAL | Status: DC | PRN
  Administered 2024-12-08: 5 mg via ORAL
  Filled 2024-12-08: qty 1

## 2024-12-08 MED ORDER — OXYCODONE HCL 5 MG PO TABS
10.0000 mg | ORAL_TABLET | ORAL | Status: DC | PRN
  Administered 2024-12-10: 10 mg via ORAL
  Filled 2024-12-08: qty 2

## 2024-12-08 MED ORDER — SODIUM CHLORIDE 0.9 % IV SOLN
2.0000 g | Freq: Three times a day (TID) | INTRAVENOUS | Status: DC
  Administered 2024-12-09 – 2024-12-10 (×4): 2 g via INTRAVENOUS
  Filled 2024-12-08 (×4): qty 12.5

## 2024-12-08 MED ORDER — GABAPENTIN 300 MG PO CAPS
300.0000 mg | ORAL_CAPSULE | Freq: Three times a day (TID) | ORAL | Status: DC
  Administered 2024-12-08 – 2024-12-10 (×5): 300 mg via ORAL
  Filled 2024-12-08 (×5): qty 1

## 2024-12-08 MED ORDER — VANCOMYCIN HCL 1500 MG/300ML IV SOLN
1500.0000 mg | Freq: Once | INTRAVENOUS | Status: AC
  Administered 2024-12-09: 1500 mg via INTRAVENOUS
  Filled 2024-12-08: qty 300

## 2024-12-08 MED ORDER — SODIUM CHLORIDE 0.9 % IV SOLN
2.0000 g | Freq: Once | INTRAVENOUS | Status: AC
  Administered 2024-12-08: 2 g via INTRAVENOUS
  Filled 2024-12-08: qty 12.5

## 2024-12-08 MED ORDER — KETOROLAC TROMETHAMINE 15 MG/ML IJ SOLN
15.0000 mg | Freq: Three times a day (TID) | INTRAMUSCULAR | Status: DC
  Administered 2024-12-08 – 2024-12-10 (×5): 15 mg via INTRAVENOUS
  Filled 2024-12-08 (×5): qty 1

## 2024-12-08 NOTE — H&P (Signed)
 Admitting Physician: Deward PARAS [name]  Service: General Surgery  CC: Right labial/thigh abscess  Subjective   HPI: Wanda Guzman is an 28 y.o. female who is here for right labial/thigh abscess.  Wanda Guzman is a 28 year old female who presents with a severe skin infection that was drained at urgent care. She is accompanied by her mother.   The skin infection began after using a hair removal product, Veet, which she has used before without issues. Previously, she experienced small bumps that resolved quickly without scarring or significant discomfort. However, this time the infection was more severe, leading to the need for drainage.   Pain has decreased in certain areas since the drainage, but she still experiences some discomfort. The infection has impacted her daily activities, making it difficult to walk up steps and causing severe pain. She is currently on a 7-day course of antibiotics.   She has a history of similar issues, possibly hidradenitis or boils, but never to the extent of causing significant pain or discomfort.   The infection site was rebandaged after bleeding occurred overnight.   Past Medical History:  Diagnosis Date   Asthma    Chronic migraine w/o aura, not intractable, w/o stat migr    SMOKES    Past Surgical History:  Procedure Laterality Date   NO PAST SURGERIES      Family History  Problem Relation Age of Onset   Miscarriages / Stillbirths Mother    Hypertension Father    Diabetes Father    Diabetes Maternal Grandmother    Diabetes Paternal Grandmother    Hypertension Other    Diabetes Mellitus I Other     Social:  reports that she has been smoking cigarettes. She has never used smokeless tobacco. She reports current alcohol use. She reports current drug use. Drug: Marijuana.  Allergies: Allergies[1]  Medications: Current Outpatient Medications  Medication Instructions   cephALEXin  (KEFLEX ) 500 mg, Oral, 4 times daily    ketorolac  (TORADOL ) 10 mg, Oral, Every 6 hours PRN, Take with food to avoid stomach upset. Do not take any additional NSAIDs while on this. You may take tylenol  in addition to this if needed for extra pain relief.   oxyCODONE (ROXICODONE) 5 mg, Oral, Every 4 hours PRN   sulfamethoxazole -trimethoprim  (BACTRIM  DS) 800-160 MG tablet 1 tablet, Oral, 2 times daily   valACYclovir  (VALTREX ) 500 mg, Oral, 2 times daily    ROS - all of the below systems have been reviewed with the patient and positives are indicated with bold text General: chills, fever or night sweats Eyes: blurry vision or double vision ENT: epistaxis or sore throat Allergy/Immunology: itchy/watery eyes or nasal congestion Hematologic/Lymphatic: bleeding problems, blood clots or swollen lymph nodes Endocrine: temperature intolerance or unexpected weight changes Breast: new or changing breast lumps or nipple discharge Resp: cough, shortness of breath, or wheezing CV: chest pain or dyspnea on exertion GI: as per HPI GU: dysuria, trouble voiding, or hematuria MSK: joint pain or joint stiffness Neuro: TIA or stroke symptoms Derm: pruritus and skin lesion changes Psych: anxiety and depression  Objective   PE Last menstrual period 11/17/2024. Constitutional: NAD; conversant; no deformities Eyes: Moist conjunctiva; no lid lag; anicteric; PERRL Neck: Trachea midline; no thyromegaly Lungs: Normal respiratory effort; no tactile fremitus CV: RRR; no palpable thrills; no pitting edema GI: Abd Soft, nontender; no palpable hepatosplenomegaly MSK: Normal range of motion of extremities; no clubbing/cyanosis Psychiatric: Appropriate affect; alert and oriented x3 Lymphatic: No palpable cervical or  axillary lymphadenopathy  Groin - Right thigh and labia with severe induration, two small I+D sites with 1/4 inch packing removed - severe pain with removal, severe pain in area of induration.  Patient does not tolerate exam.     No results  found for this or any previous visit (from the past 24 hours).  Imaging Orders  No imaging studies ordered today     Assessment and Plan   Wanda Guzman is an 28 y.o. female with a cutaneous abscess of right inner thigh and labia  Cutaneous abscess of right inner thigh and labia, drained 12/07/24 at urgent care. Currently on antibiotics for seven days. Reports some improvement in pain but still experiencing severe pain. Examination reveals two small openings for drainage, this may not be sufficient. Discussed potential need for surgical intervention if condition worsens or does not improve with current treatment. Surgery would involve opening and cleaning the area under anesthesia, resulting in a larger wound that would take longer to heal but would ensure thorough drainage of infection.  Discussed continued outpatient management vs. Admission.  Due to her severe pain she would prefer to be admitted to the hospital.  I discussed the case with Dr. Tanda, Dr. Rubin, Dr. Debby, and Dr. Aron.  The patient will be direct admitted to [facility] for further evaluation management.  We will check a CT scan, and check blood work.  She will be started on IV cefepime and vancomycin and pain medication.  Once the workup is complete we will discuss whether to proceed with additional incision and drainage for this infection.  All admission orders have been signed and held.  Please call surgeon on call to alert of patient arrival.   Deward JINNY Foy, MD  [facility] Use [email or web address] to contact on call provider     [1] No Known Allergies

## 2024-12-08 NOTE — Progress Notes (Signed)
 PT seen on arrival  Orders pending CT pending Area of concern examined and draining some pus.   Will initiate abx and await CT results to see if any further drainage needed.

## 2024-12-08 NOTE — Progress Notes (Incomplete)
 Pharmacy Antibiotic Note  Wanda Guzman is a 28 y.o. female admitted on 12/08/2024 with cellulitis.  Pharmacy has been consulted for vancomycin and cefepime dosing for 7 day course.   Vancomycin 1500 mg x1, cefepime 2 g x1 ordered on arrival.   Plan: {Assessment:21075}  Weight: 80 kg (176 lb 5.9 oz)  Temp (24hrs), Avg:98.8 F (37.1 C), Min:98.8 F (37.1 C), Max:98.8 F (37.1 C)  Recent Labs  Lab 12/07/24 1021  WBC 11.3*    CrCl cannot be calculated (Patient's most recent lab result is older than the maximum 21 days allowed.).    Allergies[1]  Antimicrobials this admission: Vancomycin 12/12 >> 12/18 planned  Cefepime 12/12 >> 12/18 planned   Thank you for allowing pharmacy to be a part of this patients care.  Rankin Sams 12/08/2024 10:42 PM     [1] No Known Allergies

## 2024-12-09 ENCOUNTER — Encounter (HOSPITAL_COMMUNITY): Payer: Self-pay

## 2024-12-09 ENCOUNTER — Inpatient Hospital Stay (HOSPITAL_COMMUNITY)

## 2024-12-09 ENCOUNTER — Other Ambulatory Visit: Payer: Self-pay

## 2024-12-09 DIAGNOSIS — L02415 Cutaneous abscess of right lower limb: Secondary | ICD-10-CM | POA: Diagnosis not present

## 2024-12-09 DIAGNOSIS — N764 Abscess of vulva: Secondary | ICD-10-CM | POA: Diagnosis not present

## 2024-12-09 LAB — CBC
HCT: 35.8 % — ABNORMAL LOW (ref 36.0–46.0)
Hemoglobin: 11.8 g/dL — ABNORMAL LOW (ref 12.0–15.0)
MCH: 28.2 pg (ref 26.0–34.0)
MCHC: 33 g/dL (ref 30.0–36.0)
MCV: 85.4 fL (ref 80.0–100.0)
Platelets: 282 K/uL (ref 150–400)
RBC: 4.19 MIL/uL (ref 3.87–5.11)
RDW: 14.1 % (ref 11.5–15.5)
WBC: 10.4 K/uL (ref 4.0–10.5)
nRBC: 0 % (ref 0.0–0.2)

## 2024-12-09 LAB — BASIC METABOLIC PANEL WITH GFR
Anion gap: 10 (ref 5–15)
BUN: 11 mg/dL (ref 6–20)
CO2: 22 mmol/L (ref 22–32)
Calcium: 8.8 mg/dL — ABNORMAL LOW (ref 8.9–10.3)
Chloride: 106 mmol/L (ref 98–111)
Creatinine, Ser: 0.94 mg/dL (ref 0.44–1.00)
GFR, Estimated: >60 mL/min (ref >60)
Glucose, Bld: 95 mg/dL (ref 70–99)
Potassium: 3.7 mmol/L (ref 3.5–5.1)
Sodium: 138 mmol/L (ref 135–145)

## 2024-12-09 LAB — PREGNANCY, URINE: Preg Test, Ur: NEGATIVE

## 2024-12-09 LAB — HIV ANTIBODY (ROUTINE TESTING W REFLEX): HIV Screen 4th Generation wRfx: NONREACTIVE

## 2024-12-09 LAB — HCG, SERUM, QUALITATIVE: Preg, Serum: NEGATIVE

## 2024-12-09 MED ORDER — VANCOMYCIN HCL IN DEXTROSE 1-5 GM/200ML-% IV SOLN
1000.0000 mg | Freq: Two times a day (BID) | INTRAVENOUS | Status: DC
  Administered 2024-12-09 – 2024-12-10 (×3): 1000 mg via INTRAVENOUS
  Filled 2024-12-09 (×3): qty 200

## 2024-12-09 MED ORDER — IOHEXOL 350 MG/ML SOLN
75.0000 mL | Freq: Once | INTRAVENOUS | Status: AC | PRN
  Administered 2024-12-09: 75 mL via INTRAVENOUS

## 2024-12-09 NOTE — Progress Notes (Signed)
° ° °  Assessment & Plan: HD#2 - right thigh and labial abscess - IV abx's - vancomycin  and cefepime  currently - spontaneous drainage, less pain - CT scan this AM pending - will review  Discussed possible need for operative drainage/debridement pending CT results this AM.        Krystal Spinner, MD [facility] practice Office: [phone]        Chief Complaint: Right thigh/labial abscess  Subjective: Patient in bed, nurse at bedside.  Less pain.  Continued drainage.  Objective: Vital signs in last 24 hours: Temp:  [98 F (36.7 C)-98.8 F (37.1 C)] 98 F (36.7 C) (12/13 0804) Pulse Rate:  [61-67] 67 (12/13 0804) Resp:  [16] 16 (12/13 0804) BP: (112-121)/(49-69) 112/49 (12/13 0804) SpO2:  [100 %] 100 % (12/13 0804) Weight:  [80 kg] 80 kg (12/12 2052) Last BM Date : 12/05/24  Intake/Output from previous day: 12/12 0701 - 12/13 0700 In: 790.7 [I.V.:408.3; IV Piggyback:382.4] Out: -  Intake/Output this shift: No intake/output data recorded.  Physical Exam: HEENT - sclerae clear, mucous membranes moist deferred  Lab Results:  Recent Labs    12/07/24 1021 12/09/24 0408  WBC 11.3* 10.4  HGB 13.0 11.8*  HCT 42.0 35.8*  PLT 278 282   BMET Recent Labs    12/09/24 0408  NA 138  K 3.7  CL 106  CO2 22  GLUCOSE 95  BUN 11  CREATININE 0.94  CALCIUM 8.8*   PT/INR No results for input(s): LABPROT, INR in the last 72 hours. Comprehensive Metabolic Panel:    Component Value Date/Time   NA 138 12/09/2024 0408   NA 137 02/09/2022 1529   K 3.7 12/09/2024 0408   K 3.8 02/09/2022 1529   CL 106 12/09/2024 0408   CL 101 02/09/2022 1529   CO2 22 12/09/2024 0408   CO2 26 02/09/2022 1529   BUN 11 12/09/2024 0408   BUN 11 02/09/2022 1529   CREATININE 0.94 12/09/2024 0408   CREATININE 0.95 02/09/2022 1529   GLUCOSE 95 12/09/2024 0408   GLUCOSE 96 02/09/2022 1529   CALCIUM 8.8 (L) 12/09/2024 0408   CALCIUM 9.3 02/09/2022 1529   AST 24  02/09/2022 1529   AST 22 04/05/2020 0700   ALT 17 02/09/2022 1529   ALT 12 04/05/2020 0700   ALKPHOS 46 02/09/2022 1529   ALKPHOS 49 04/05/2020 0700   BILITOT 0.7 02/09/2022 1529   BILITOT 0.6 04/05/2020 0700   PROT 7.1 02/09/2022 1529   PROT 7.4 04/05/2020 0700   ALBUMIN 4.2 02/09/2022 1529   ALBUMIN 3.9 04/05/2020 0700    Studies/Results: No results found.    Krystal Spinner 12/09/2024  Patient ID: Wanda Guzman, female   DOB: July 06, 1996, 28 y.o.   MRN: [id number]

## 2024-12-09 NOTE — Progress Notes (Signed)
 CT reviewed.  No need for further I&D. OK to remove packing Con't abx

## 2024-12-09 NOTE — Progress Notes (Signed)
 Pharmacy Antibiotic Note  Wanda Guzman is a 28 y.o. female admitted on 12/08/2024 with cellulitis.  Pharmacy has been consulted for vancomycin  and cefepime  dosing.  Vancomycin  1500 mg x1, cefepime  2 g x1 ordered on arrival.   Plan: Vancomycin  1000 mg IV q12h >>>Estimated AUC: 534 Cefepime  2g IV q8h Trend WBC, temp, renal function  F/U infectious work-up Drug levels as indicated   Weight: 80 kg (176 lb 5.9 oz)  Temp (24hrs), Avg:98.7 F (37.1 C), Min:98.6 F (37 C), Max:98.8 F (37.1 C)  Recent Labs  Lab 12/07/24 1021 12/08/24 2306 12/09/24 0408  WBC 11.3*  --  10.4  CREATININE  --   --  0.94  LATICACIDVEN  --  0.7  --     Estimated Creatinine Clearance: 88.4 mL/min (by C-G formula based on SCr of 0.94 mg/dL).    Allergies[1]  Antimicrobials this admission: Vancomycin  12/12>> Cefepime  12/12 >>  Lynwood Mckusick, PharmD, BCPS Clinical Pharmacist Phone: [phone]      [1] No Known Allergies

## 2024-12-09 NOTE — Plan of Care (Signed)
°  Problem: Education: Goal: Knowledge of General Education information will improve Description: Including pain rating scale, medication(s)/side effects and non-pharmacologic comfort measures Outcome: Progressing   Problem: Health Behavior/Discharge Planning: Goal: Ability to manage health-related needs will improve Outcome: Progressing   Problem: Clinical Measurements: Goal: Ability to maintain clinical measurements within normal limits will improve Outcome: Progressing Goal: Will remain free from infection Outcome: Progressing Goal: Diagnostic test results will improve Outcome: Progressing Goal: Respiratory complications will improve Outcome: Progressing Goal: Cardiovascular complication will be avoided Outcome: Progressing   Problem: Activity: Goal: Risk for activity intolerance will decrease Outcome: Progressing   Problem: Nutrition: Goal: Adequate nutrition will be maintained Outcome: Progressing   Problem: Coping: Goal: Level of anxiety will decrease Outcome: Progressing   Problem: Elimination: Goal: Will not experience complications related to bowel motility Outcome: Progressing Goal: Will not experience complications related to urinary retention Outcome: Progressing   Problem: Pain Managment: Goal: General experience of comfort will improve and/or be controlled Outcome: Progressing   Problem: Skin Integrity: Goal: Risk for impaired skin integrity will decrease Outcome: Progressing   Problem: Clinical Measurements: Goal: Ability to avoid or minimize complications of infection will improve Outcome: Progressing   Problem: Safety: Goal: Ability to remain free from injury will improve Outcome: Progressing   Problem: Clinical Measurements: Goal: Ability to avoid or minimize complications of infection will improve Outcome: Progressing   Problem: Skin Integrity: Goal: Skin integrity will improve Outcome: Progressing

## 2024-12-09 NOTE — Plan of Care (Signed)
 Labial packing partially removed. Pulled out about about 4 inches, however pt was in severe pain during pull.  Stopped and cut packing, MD to evaluate tomorrow.  Problem: Education: Goal: Knowledge of General Education information will improve Description: Including pain rating scale, medication(s)/side effects and non-pharmacologic comfort measures Outcome: Progressing   Problem: Health Behavior/Discharge Planning: Goal: Ability to manage health-related needs will improve Outcome: Progressing   Problem: Clinical Measurements: Goal: Ability to maintain clinical measurements within normal limits will improve Outcome: Progressing Goal: Will remain free from infection Outcome: Progressing Goal: Diagnostic test results will improve Outcome: Progressing Goal: Respiratory complications will improve Outcome: Progressing Goal: Cardiovascular complication will be avoided Outcome: Progressing   Problem: Activity: Goal: Risk for activity intolerance will decrease Outcome: Progressing   Problem: Nutrition: Goal: Adequate nutrition will be maintained Outcome: Progressing   Problem: Coping: Goal: Level of anxiety will decrease Outcome: Progressing   Problem: Elimination: Goal: Will not experience complications related to bowel motility Outcome: Progressing Goal: Will not experience complications related to urinary retention Outcome: Progressing   Problem: Pain Managment: Goal: General experience of comfort will improve and/or be controlled Outcome: Progressing   Problem: Safety: Goal: Ability to remain free from injury will improve Outcome: Progressing   Problem: Skin Integrity: Goal: Risk for impaired skin integrity will decrease Outcome: Progressing   Problem: Clinical Measurements: Goal: Ability to avoid or minimize complications of infection will improve Outcome: Progressing   Problem: Clinical Measurements: Goal: Ability to avoid or minimize complications of infection  will improve Outcome: Progressing   Problem: Skin Integrity: Goal: Skin integrity will improve Outcome: Progressing

## 2024-12-09 NOTE — Plan of Care (Signed)

## 2024-12-10 ENCOUNTER — Other Ambulatory Visit (HOSPITAL_COMMUNITY): Payer: Self-pay

## 2024-12-10 DIAGNOSIS — L02415 Cutaneous abscess of right lower limb: Secondary | ICD-10-CM | POA: Diagnosis not present

## 2024-12-10 LAB — CBC
HCT: 31.9 % — ABNORMAL LOW (ref 36.0–46.0)
Hemoglobin: 10.6 g/dL — ABNORMAL LOW (ref 12.0–15.0)
MCH: 28.1 pg (ref 26.0–34.0)
MCHC: 33.2 g/dL (ref 30.0–36.0)
MCV: 84.6 fL (ref 80.0–100.0)
Platelets: 245 K/uL (ref 150–400)
RBC: 3.77 MIL/uL — ABNORMAL LOW (ref 3.87–5.11)
RDW: 14.1 % (ref 11.5–15.5)
WBC: 6.5 K/uL (ref 4.0–10.5)
nRBC: 0 % (ref 0.0–0.2)

## 2024-12-10 LAB — BASIC METABOLIC PANEL WITH GFR
Anion gap: 9 (ref 5–15)
BUN: 8 mg/dL (ref 6–20)
CO2: 23 mmol/L (ref 22–32)
Calcium: 8.5 mg/dL — ABNORMAL LOW (ref 8.9–10.3)
Chloride: 104 mmol/L (ref 98–111)
Creatinine, Ser: 0.79 mg/dL (ref 0.44–1.00)
GFR, Estimated: >60 mL/min (ref >60)
Glucose, Bld: 93 mg/dL (ref 70–99)
Potassium: 3.8 mmol/L (ref 3.5–5.1)
Sodium: 136 mmol/L (ref 135–145)

## 2024-12-10 MED ORDER — AMOXICILLIN-POT CLAVULANATE 875-125 MG PO TABS: 1.0000 | ORAL_TABLET | Freq: Two times a day (BID) | ORAL | 0 refills | Status: DC

## 2024-12-10 MED ORDER — AMOXICILLIN-POT CLAVULANATE 875-125 MG PO TABS
1.0000 | ORAL_TABLET | Freq: Two times a day (BID) | ORAL | 0 refills | Status: AC
  Filled 2024-12-10 (×2): qty 14, 7d supply, fill #0

## 2024-12-10 NOTE — Progress Notes (Signed)
 Updated pt and mother of surgeon's note; No need for surgery according to note after CT results.

## 2024-12-10 NOTE — Discharge Summary (Signed)
°  °  Physician Discharge Summary   Patient ID: Wanda Guzman MRN: [id number] DOB/AGE: 03-04-1996 28 y.o.  Admit date: 12/08/2024  Discharge date: 12/10/2024  Discharge Diagnoses:  Principal Problem:   Abscess of right thigh   Discharged Condition: good  Hospital Course: Patient was admitted for observation for cellulitis with small abscess right groin.  Pain was well controlled.  Tolerated diet.  CT scan showed small resolving abscess with spontaneous drainage.  Patient was prepared for discharge home on HD#2.  Consults: None  Treatments: antibiotics: vancomycin  and cefepime   Discharge Exam: Blood pressure (!) 102/54, pulse 68, temperature 98.3 F (36.8 C), resp. rate 16, weight 80 kg, last menstrual period 11/17/2024, SpO2 96%. HEENT - clear GYN - labia normal GU - medial right thigh with sinus tract/wound with packing removed, small purulent drainage on gauze  Disposition: Home  Discharge Instructions     Discharge wound care:   Complete by: As directed    Tub soaks as instructed.  Dry gauze dressing - change as needed.   Increase activity slowly   Complete by: As directed          Follow-up Information     [facility] Schedule an appointment as soon as possible for a visit in 10 day(s).   Specialty: General Surgery Why: For wound re-check Contact information: [address] [place]  [address] [phone]                Krystal Spinner, MD [facility] Office: [phone]   Signed: Krystal Spinner 12/10/2024, 10:11 AM

## 2024-12-10 NOTE — Progress Notes (Addendum)
 Wound packing completely taken out per MD request. Pt is tearful: I just want to get out of here. Supportive listening and encouragement provided. Mom has not left bedside since admission.

## 2024-12-10 NOTE — Discharge Instructions (Signed)
°  [facility] -- DISCHARGE INSTRUCTIONS  REMINDER:   Carry a list of your medications and allergies with you at all times  Call your pharmacy at least 1 week in advance to refill prescriptions  Do not mix any prescribed pain medicine with alcohol  Do not drive any motor vehicles while taking pain medication  Take medications with food unless otherwise directed  Follow-up appointments (date to return to physician): Please call [phone] to confirm your follow up appointment with your surgeon.  Call your Surgeon if you have:  Temperature greater than 101.0  Persistent nausea and vomiting  Severe uncontrolled pain  Redness, tenderness, or signs of infection (pain, swelling, redness, odor or green/yellow discharge around the site)  Difficulty breathing, headache or visual disturbances  Hives  Persistent dizziness or light-headedness  Any other questions or concerns you may have after discharge  In an emergency, call 911 or go to an Emergency Department at a nearby hospital.  Diet: Begin with liquids, and if they are tolerated, resume your usual diet.  Avoid spicy, greasy or heavy foods.  If you have nausea or vomiting, go back to liquids.  If you cannot keep liquids down, call your doctor.  Avoid alcohol consumption while on prescription pain medications. Good nutrition promotes healing. Increase fiber and fluids.   ADDITIONAL INSTRUCTIONS: Tub soaks 2-3 times a day, may use Epsom salts.  Dry gauze dressing - change as needed.  Take all of antibiotics prescribed.  [facility] Office: [phone]

## 2024-12-11 ENCOUNTER — Other Ambulatory Visit (HOSPITAL_COMMUNITY): Payer: Self-pay

## 2024-12-11 LAB — AEROBIC CULTURE W GRAM STAIN (SUPERFICIAL SPECIMEN): Culture: NORMAL

## 2024-12-12 ENCOUNTER — Other Ambulatory Visit: Payer: Self-pay

## 2024-12-12 ENCOUNTER — Emergency Department (HOSPITAL_COMMUNITY)

## 2024-12-12 ENCOUNTER — Emergency Department (HOSPITAL_COMMUNITY): Admission: EM | Admit: 2024-12-12 | Discharge: 2024-12-12 | Disposition: A | Discharge status: Home / Self Care

## 2024-12-12 ENCOUNTER — Encounter (HOSPITAL_COMMUNITY): Payer: Self-pay

## 2024-12-12 DIAGNOSIS — R0789 Other chest pain: Secondary | ICD-10-CM | POA: Diagnosis not present

## 2024-12-12 DIAGNOSIS — R7401 Elevation of levels of liver transaminase levels: Secondary | ICD-10-CM | POA: Diagnosis not present

## 2024-12-12 DIAGNOSIS — K2970 Gastritis, unspecified, without bleeding: Secondary | ICD-10-CM | POA: Diagnosis not present

## 2024-12-12 DIAGNOSIS — R079 Chest pain, unspecified: Secondary | ICD-10-CM | POA: Diagnosis not present

## 2024-12-12 DIAGNOSIS — D72829 Elevated white blood cell count, unspecified: Secondary | ICD-10-CM | POA: Diagnosis not present

## 2024-12-12 LAB — BASIC METABOLIC PANEL WITH GFR
Anion gap: 13 (ref 5–15)
BUN: 11 mg/dL (ref 6–20)
CO2: 19 mmol/L — ABNORMAL LOW (ref 22–32)
Calcium: 9.2 mg/dL (ref 8.9–10.3)
Chloride: 104 mmol/L (ref 98–111)
Creatinine, Ser: 0.8 mg/dL (ref 0.44–1.00)
GFR, Estimated: >60 mL/min (ref >60)
Glucose, Bld: 122 mg/dL — ABNORMAL HIGH (ref 70–99)
Potassium: 3.8 mmol/L (ref 3.5–5.1)
Sodium: 136 mmol/L (ref 135–145)

## 2024-12-12 LAB — CBC
HCT: 37.5 % (ref 36.0–46.0)
Hemoglobin: 12.3 g/dL (ref 12.0–15.0)
MCH: 28.1 pg (ref 26.0–34.0)
MCHC: 32.8 g/dL (ref 30.0–36.0)
MCV: 85.8 fL (ref 80.0–100.0)
Platelets: 303 K/uL (ref 150–400)
RBC: 4.37 MIL/uL (ref 3.87–5.11)
RDW: 14.4 % (ref 11.5–15.5)
WBC: 12.7 K/uL — ABNORMAL HIGH (ref 4.0–10.5)
nRBC: 0 % (ref 0.0–0.2)

## 2024-12-12 LAB — HEPATIC FUNCTION PANEL
ALT: 60 U/L — ABNORMAL HIGH (ref 0–44)
AST: 121 U/L — ABNORMAL HIGH (ref 15–41)
Albumin: 4 g/dL (ref 3.5–5.0)
Alkaline Phosphatase: 69 U/L (ref 38–126)
Bilirubin, Direct: <0.1 mg/dL (ref 0.0–0.2)
Total Bilirubin: <0.2 mg/dL (ref 0.0–1.2)
Total Protein: 6.9 g/dL (ref 6.5–8.1)

## 2024-12-12 LAB — TROPONIN T, HIGH SENSITIVITY: Troponin T High Sensitivity: <15 ng/L (ref 0–19)

## 2024-12-12 LAB — LIPASE, BLOOD: Lipase: 28 U/L (ref 11–51)

## 2024-12-12 LAB — HCG, SERUM, QUALITATIVE: Preg, Serum: NEGATIVE

## 2024-12-12 MED ORDER — LIDOCAINE VISCOUS HCL 2 % MT SOLN
15.0000 mL | Freq: Once | OROMUCOSAL | Status: AC
  Administered 2024-12-12: 06:00:00 15 mL via ORAL
  Filled 2024-12-12: qty 15

## 2024-12-12 MED ORDER — ALUM & MAG HYDROXIDE-SIMETH 200-200-20 MG/5ML PO SUSP
30.0000 mL | Freq: Once | ORAL | Status: AC
  Administered 2024-12-12: 06:00:00 30 mL via ORAL
  Filled 2024-12-12: qty 30

## 2024-12-12 MED ORDER — SUCRALFATE 1 G PO TABS: 1.0000 g | ORAL_TABLET | Freq: Three times a day (TID) | ORAL | 0 refills | Status: AC

## 2024-12-12 MED ORDER — FAMOTIDINE 40 MG PO TABS: 40.0000 mg | ORAL_TABLET | Freq: Every day | ORAL | 0 refills | Status: AC

## 2024-12-12 NOTE — ED Provider Notes (Signed)
 [place] EMERGENCY DEPARTMENT AT [facility] Provider Note   CSN: [id number] Arrival date & time: 12/12/24  0446     Patient presents with: Chest Pain   Wanda Guzman is a 28 y.o. female.   28 year old female presents for evaluation of chest pain.  States that started tonight woke her up out of sleep.  States it was in her upper abdomen and radiated to her chest into both shoulders.  States it was a burning sensation and she also felt nauseous at the time.  States she was recently hospitalized for cellulitis and an abscess.  She states that overall that is improving.  She admits to 1 episode of vomiting tonight as well.  Denies any other symptoms or concerns.   Chest Pain Associated symptoms: abdominal pain and nausea   Associated symptoms: no back pain, no cough, no fever, no palpitations, no shortness of breath and no vomiting        Prior to Admission medications  Medication Sig Start Date End Date Taking? Authorizing Provider  famotidine  (PEPCID ) 40 MG tablet Take 1 tablet (40 mg total) by mouth daily. 12/12/24 01/11/25 Yes Takirah Binford L, DO  sucralfate  (CARAFATE ) 1 g tablet Take 1 tablet (1 g total) by mouth 4 (four) times daily -  with meals and at bedtime. 12/12/24 12/26/24 Yes Fawn Desrocher L, DO  amoxicillin -clavulanate (AUGMENTIN ) 875-125 MG tablet Take 1 tablet by mouth 2 (two) times daily. 12/10/24   Eletha Boas, MD  ketorolac  (TORADOL ) 10 MG tablet Take 1 tablet (10 mg total) by mouth every 6 (six) hours as needed for moderate pain (pain score 4-6). Take with food to avoid stomach upset. Do not take any additional NSAIDs while on this. You may take tylenol  in addition to this if needed for extra pain relief. Patient taking differently: Take 10 mg by mouth every 6 (six) hours as needed for moderate pain (pain score 4-6) or severe pain (pain score 7-10). 12/07/24   Iola Lukes, FNP  oxyCODONE  (ROXICODONE ) 5 MG immediate release tablet Take 1  tablet (5 mg total) by mouth every 4 (four) hours as needed for severe pain (pain score 7-10). 12/07/24   Murrill, Samantha, FNP  valACYclovir  (VALTREX ) 500 MG tablet Take 1 tablet (500 mg total) by mouth 2 (two) times daily. 09/28/24   Nche, Roselie Rockford, NP  topiramate ER (QUDEXY XR) CS24 sprinkle capsule Take 100 mg by mouth daily.  09/20/19  [provider]    Allergies: Patient has no known allergies.    Review of Systems  Constitutional:  Negative for chills and fever.  HENT:  Negative for ear pain and sore throat.   Eyes:  Negative for pain and visual disturbance.  Respiratory:  Negative for cough and shortness of breath.   Cardiovascular:  Positive for chest pain. Negative for palpitations.  Gastrointestinal:  Positive for abdominal pain and nausea. Negative for vomiting.  Genitourinary:  Negative for dysuria and hematuria.  Musculoskeletal:  Negative for arthralgias and back pain.  Skin:  Negative for color change and rash.  Neurological:  Negative for seizures and syncope.  All other systems reviewed and are negative.   Updated Vital Signs BP 123/66   Pulse 90   Temp 98.6 F (37 C) (Oral)   Resp 17   LMP 11/17/2024 (Exact Date)   SpO2 100%   Physical Exam Vitals and nursing note reviewed.  Constitutional:      General: She is not in acute distress.  Appearance: She is well-developed. She is not ill-appearing.  HENT:     Head: Normocephalic and atraumatic.  Eyes:     Conjunctiva/sclera: Conjunctivae normal.  Cardiovascular:     Rate and Rhythm: Normal rate and regular rhythm.     Heart sounds: No murmur heard. Pulmonary:     Effort: Pulmonary effort is normal. No respiratory distress.     Breath sounds: Normal breath sounds.  Chest:     Chest wall: Tenderness present.     Comments: Lateral chest wall tenderness to palpation, bilateral shoulder tenderness to palpation Abdominal:     Palpations: Abdomen is soft.     Tenderness: There is no abdominal  tenderness.  Musculoskeletal:        General: No swelling.     Cervical back: Neck supple.  Skin:    General: Skin is warm and dry.     Capillary Refill: Capillary refill takes less than 2 seconds.  Neurological:     Mental Status: She is alert.  Psychiatric:        Mood and Affect: Mood normal.     (all labs ordered are listed, but only abnormal results are displayed) Labs Reviewed  BASIC METABOLIC PANEL WITH GFR - Abnormal; Notable for the following components:      Result Value   CO2 19 (*)    Glucose, Bld 122 (*)    All other components within normal limits  CBC - Abnormal; Notable for the following components:   WBC 12.7 (*)    All other components within normal limits  HEPATIC FUNCTION PANEL - Abnormal; Notable for the following components:   AST 121 (*)    ALT 60 (*)    All other components within normal limits  HCG, SERUM, QUALITATIVE  LIPASE, BLOOD  TROPONIN T, HIGH SENSITIVITY  TROPONIN T, HIGH SENSITIVITY    EKG: EKG Interpretation Date/Time:  Tuesday December 12 2024 05:03:46 EST Ventricular Rate:  85 PR Interval:  134 QRS Duration:  81 QT Interval:  348 QTC Calculation: 414 R Axis:   59  Text Interpretation: Sinus rhythm No significant changes when compared to prior EKG from 09/27/2018 Confirmed by Gennaro Bouchard ([id number]) on 12/12/2024 5:10:51 AM  Radiology: [facility] Chest 2 View Result Date: 12/12/2024 EXAM: 2 VIEW(S) XRAY OF THE CHEST 12/12/2024 05:47:00 AM COMPARISON: 10/05/2024 CLINICAL HISTORY: chest pain FINDINGS: LUNGS AND PLEURA: No focal pulmonary opacity. No pleural effusion. No pneumothorax. HEART AND MEDIASTINUM: No acute abnormality of the cardiac and mediastinal silhouettes. BONES AND SOFT TISSUES: No acute osseous abnormality. IMPRESSION: 1. No acute cardiopulmonary pathology. Electronically signed by: Waddell Calk MD 12/12/2024 05:56 AM EST RP Workstation: [id number]     Procedures   Medications Ordered in the ED  alum & mag  hydroxide-simeth (MAALOX/MYLANTA) 200-200-20 MG/5ML suspension 30 mL (30 mLs Oral Given 12/12/24 0534)    And  lidocaine  (XYLOCAINE ) 2 % viscous mouth solution 15 mL (15 mLs Oral Given 12/12/24 0534)                                    Medical Decision Making Cardiac monitor interpretation: Sinus rhythm, no ectopy  Patient here for chest pain that is resolved at this time.  She is feeling much better after GI cocktail.  EKG and chest x-ray unremarkable.  Lab workup is within normal limits, troponin is negative.  On reevaluation patient is feeling much improved.  Suspect likely some element  of gastritis.  Will start her on Pepcid  and Carafate .  Advised Tylenol  Motrin  as needed for pain and obtain close follow-up with primary care.  Advised return to the ER for any new or worsening symptoms, and follow-up with her specialist as recommended on her hospital discharge papers.  She feels comfortable to plan to be discharged home.  Problems Addressed: Atypical chest pain: acute illness or injury Gastritis without bleeding, unspecified chronicity, unspecified gastritis type: acute illness or injury  Amount and/or Complexity of Data Reviewed External Data Reviewed: notes.    Details: Prior hospital records were reviewed and patient was discharged yesterday for cellulitis/abscess Labs: ordered. Decision-making details documented in ED Course.    Details: Ordered and reviewed by me and unremarkable except for very mild leukocytosis, slight elevation in AST more than ALT, troponin negative Radiology: ordered and independent interpretation performed. Decision-making details documented in ED Course.    Details: Ordered and interpreted by me independently of radiology Chest x-ray: Shows no acute abnormality ECG/medicine tests: ordered and independent interpretation performed. Decision-making details documented in ED Course.    Details: Ordered and interpreted by me in the absence of cardiology and shows  sinus rhythm, no STEMI, or significant change when compared to prior EKG  Risk OTC drugs. Prescription drug management.     Final diagnoses:  Atypical chest pain  Gastritis without bleeding, unspecified chronicity, unspecified gastritis type    ED Discharge Orders          Ordered    famotidine  (PEPCID ) 40 MG tablet  Daily        12/12/24 0645    sucralfate  (CARAFATE ) 1 g tablet  3 times daily with meals & bedtime        12/12/24 0645               Gennaro Duwaine CROME, DO 12/12/24 [phone]

## 2024-12-12 NOTE — ED Triage Notes (Signed)
 Pt reports with sharp center chest pain, left arm, and neck pain since midnight. Pt states that she had to make herself throw up.

## 2024-12-12 NOTE — Discharge Instructions (Addendum)
 Take your Pepcid  and Carafate  as prescribed.  You can use Tylenol  and Motrin  as needed for pain.  Continue your other medications as prescribed and obtain close follow-up with your primary care doctor and specialist as recommended on your discharge paperwork.  Return to the ER for any new or worsening symptoms.

## 2024-12-13 ENCOUNTER — Ambulatory Visit (HOSPITAL_COMMUNITY): Payer: Self-pay

## 2025-01-05 ENCOUNTER — Other Ambulatory Visit (HOSPITAL_COMMUNITY): Payer: Self-pay

## 2025-01-25 ENCOUNTER — Encounter: Admitting: Nurse Practitioner

## 2025-03-15 ENCOUNTER — Encounter: Admitting: Nurse Practitioner
